# Patient Record
Sex: Female | Born: 1970 | Race: White | Hispanic: No | Marital: Married | Smoking: Current some day smoker
Health system: Southern US, Community
[De-identification: ages and names within clinical notes are randomized; demographics above are authoritative.]

## PROBLEM LIST (undated history)

## (undated) DIAGNOSIS — E7800 Pure hypercholesterolemia, unspecified: Secondary | ICD-10-CM

## (undated) DIAGNOSIS — R7303 Prediabetes: Secondary | ICD-10-CM

## (undated) DIAGNOSIS — F419 Anxiety disorder, unspecified: Secondary | ICD-10-CM

## (undated) DIAGNOSIS — R42 Dizziness and giddiness: Secondary | ICD-10-CM

## (undated) DIAGNOSIS — K649 Unspecified hemorrhoids: Secondary | ICD-10-CM

## (undated) DIAGNOSIS — M5030 Other cervical disc degeneration, unspecified cervical region: Secondary | ICD-10-CM

## (undated) HISTORY — DX: Other cervical disc degeneration, unspecified cervical region: M50.30

## (undated) HISTORY — DX: Pure hypercholesterolemia, unspecified: E78.00

## (undated) HISTORY — PX: CHOLECYSTECTOMY: SHX55

## (undated) HISTORY — PX: TUBAL LIGATION: SHX77

## (undated) HISTORY — PX: HEMORRHOID SURGERY: SHX153

## (undated) HISTORY — PX: ABLATION: SHX5711

## (undated) HISTORY — DX: Anxiety disorder, unspecified: F41.9

## (undated) HISTORY — DX: Unspecified hemorrhoids: K64.9

## (undated) HISTORY — PX: SPINAL FUSION: SHX223

## (undated) HISTORY — DX: Prediabetes: R73.03

---

## 2013-03-15 ENCOUNTER — Encounter: Payer: Self-pay | Admitting: Sports Medicine

## 2013-03-15 ENCOUNTER — Ambulatory Visit (INDEPENDENT_AMBULATORY_CARE_PROVIDER_SITE_OTHER): Payer: Managed Care, Other (non HMO) | Admitting: Sports Medicine

## 2013-03-15 VITALS — BP 131/82 | HR 72 | Wt 194.0 lb

## 2013-03-15 DIAGNOSIS — R809 Proteinuria, unspecified: Secondary | ICD-10-CM

## 2013-03-15 DIAGNOSIS — G4700 Insomnia, unspecified: Secondary | ICD-10-CM

## 2013-03-15 DIAGNOSIS — F411 Generalized anxiety disorder: Secondary | ICD-10-CM

## 2013-03-15 DIAGNOSIS — Z0000 Encounter for general adult medical examination without abnormal findings: Secondary | ICD-10-CM | POA: Insufficient documentation

## 2013-03-15 DIAGNOSIS — Z299 Encounter for prophylactic measures, unspecified: Secondary | ICD-10-CM

## 2013-03-15 LAB — POCT URINALYSIS DIPSTICK
Bilirubin, UA: NEGATIVE
Blood, UA: NEGATIVE
Glucose, UA: NEGATIVE
Ketones, UA: NEGATIVE
Leukocytes, UA: NEGATIVE
Nitrite, UA: NEGATIVE
Protein, UA: NEGATIVE
Spec Grav, UA: 1.025
Urobilinogen, UA: 0.2
pH, UA: 7

## 2013-03-15 MED ORDER — HYDROXYZINE HCL 50 MG PO TABS: 50.0000 mg | ORAL_TABLET | Freq: Every evening | ORAL | Status: DC | PRN

## 2013-03-15 NOTE — Assessment & Plan Note (Signed)
Felt too groggy on Ambien, I wonder if this is a dose related effect. I am going to start Vistaril at bedtime. She has requested Xanax, I think we should try other medications first.

## 2013-03-15 NOTE — Assessment & Plan Note (Signed)
With a high GAD 7 score and difficulty sleeping, however this is not interfering with her life, and she does not desire pharmacologic therapy or psychotherapy.

## 2013-03-15 NOTE — Progress Notes (Signed)
  Subjective:    CC: Establish care.   HPI: Sarely is a very pleasant 42 year old female without any medical problems.  Anxiety: She seems stressed at work, but is able to function appropriately, she is stressed out at home occasionally, but is also able to function appropriately. She does not desire pharmacologic or psychotherapy evaluation. She does get some numbness in the bilateral upper lip when stressed out.  Insomnia:  Tried melatonin but this didn't help, tried Ambien but felt groggy, wondering if she can try Xanax. She has difficulty staying asleep but no problem falling asleep.  Preventative measures: Has an OB/GYN that does her Pap smears, last blood work was over 2 years ago.  Past medical history, Surgical history, Family history not pertinant except as noted below, Social history, Allergies, and medications have been entered into the medical record, reviewed, and no changes needed.   Review of Systems: No headache, visual changes, nausea, vomiting, diarrhea, constipation, dizziness, abdominal pain, skin rash, fevers, chills, night sweats, swollen lymph nodes, weight loss, chest pain, body aches, joint swelling, muscle aches, shortness of breath, mood changes, visual or auditory hallucinations.  Objective:    General: Well Developed, well nourished, and in no acute distress.  Neuro: Alert and oriented x3, extra-ocular muscles intact, sensation grossly intact.  HEENT: Normocephalic, atraumatic, pupils equal round reactive to light, neck supple, no masses, no lymphadenopathy, thyroid nonpalpable.  Skin: Warm and dry, no rashes noted.  Cardiac: Regular rate and rhythm, no murmurs rubs or gallops.  Respiratory: Clear to auscultation bilaterally. Not using accessory muscles, speaking in full sentences.  Abdominal: Soft, nontender, nondistended, positive bowel sounds, no masses, no organomegaly.  Musculoskeletal: Shoulder, elbow, wrist, hip, knee, ankle stable, and with full range of  motion Impression and Recommendations:    The patient was counselled, risk factors were discussed, anticipatory guidance given.

## 2013-03-15 NOTE — Assessment & Plan Note (Signed)
Dr. Allena Katz in [facility] does her Paps. Last mammogram was 6 months ago, she does have a family history of breast cancer. Checking routine blood work. She has an appointment with [place] in a few days for complete physical

## 2013-03-16 LAB — CBC
HCT: 41.4 % (ref 36.0–46.0)
Hemoglobin: 13.7 g/dL (ref 12.0–15.0)
MCH: 29.7 pg (ref 26.0–34.0)
MCHC: 33.1 g/dL (ref 30.0–36.0)
MCV: 89.8 fL (ref 78.0–100.0)
Platelets: 244 K/uL (ref 150–400)
RBC: 4.61 MIL/uL (ref 3.87–5.11)
RDW: 12.9 % (ref 11.5–15.5)
WBC: 6.8 K/uL (ref 4.0–10.5)

## 2013-03-17 ENCOUNTER — Encounter: Payer: Self-pay | Admitting: Sports Medicine

## 2013-03-17 ENCOUNTER — Ambulatory Visit (INDEPENDENT_AMBULATORY_CARE_PROVIDER_SITE_OTHER): Payer: Managed Care, Other (non HMO) | Admitting: Sports Medicine

## 2013-03-17 VITALS — BP 133/84 | HR 81 | Wt 192.0 lb

## 2013-03-17 DIAGNOSIS — G4700 Insomnia, unspecified: Secondary | ICD-10-CM

## 2013-03-17 DIAGNOSIS — Z0000 Encounter for general adult medical examination without abnormal findings: Secondary | ICD-10-CM

## 2013-03-17 DIAGNOSIS — Z299 Encounter for prophylactic measures, unspecified: Secondary | ICD-10-CM

## 2013-03-17 DIAGNOSIS — L570 Actinic keratosis: Secondary | ICD-10-CM | POA: Insufficient documentation

## 2013-03-17 LAB — COMPREHENSIVE METABOLIC PANEL WITH GFR
ALT: 13 U/L (ref 0–35)
Albumin: 4.2 g/dL (ref 3.5–5.2)
CO2: 26 meq/L (ref 19–32)
Calcium: 9.4 mg/dL (ref 8.4–10.5)
Chloride: 104 meq/L (ref 96–112)
Glucose, Bld: 92 mg/dL (ref 70–99)
Sodium: 143 meq/L (ref 135–145)
Total Protein: 6.6 g/dL (ref 6.0–8.3)

## 2013-03-17 LAB — COMPREHENSIVE METABOLIC PANEL
AST: 15 U/L (ref 0–37)
Alkaline Phosphatase: 66 U/L (ref 39–117)
BUN: 11 mg/dL (ref 6–23)
Creat: 0.77 mg/dL (ref 0.50–1.10)
Potassium: 4.4 mEq/L (ref 3.5–5.3)
Total Bilirubin: 0.4 mg/dL (ref 0.3–1.2)

## 2013-03-17 LAB — LIPID PANEL
Cholesterol: 213 mg/dL — ABNORMAL HIGH (ref 0–200)
HDL: 52 mg/dL (ref >39)
LDL Cholesterol: 129 mg/dL — ABNORMAL HIGH (ref 0–99)
Total CHOL/HDL Ratio: 4.1 Ratio
Triglycerides: 160 mg/dL — ABNORMAL HIGH (ref <150)
VLDL: 32 mg/dL (ref 0–40)

## 2013-03-17 LAB — VITAMIN D 25 HYDROXY (VIT D DEFICIENCY, FRACTURES): Vit D, 25-Hydroxy: 55 ng/mL (ref 30–89)

## 2013-03-17 LAB — TSH: TSH: 2.887 u[IU]/mL (ref 0.350–4.500)

## 2013-03-17 NOTE — Progress Notes (Signed)
  Subjective:    CC: Complete physical  HPI:  Insomnia: Got to sleep more easily but still woke up at night with Vistaril 50.  Preventive measure: Is going to set up mammogram and Pap smear with her gynecologist.  Skin lesion: Has popped up over the past few weeks, left nasolabial fold, raised. No history of skin cancer.  Past medical history, Surgical history, Family history not pertinant except as noted below, Social history, Allergies, and medications have been entered into the medical record, reviewed, and no changes needed.   Review of Systems: No headache, visual changes, nausea, vomiting, diarrhea, constipation, dizziness, abdominal pain, skin rash, fevers, chills, night sweats, swollen lymph nodes, weight loss, chest pain, body aches, joint swelling, muscle aches, shortness of breath, mood changes, visual or auditory hallucinations.  Objective:    General: Well Developed, well nourished, and in no acute distress.  Neuro: Alert and oriented x3, extra-ocular muscles intact, sensation grossly intact.  HEENT: Normocephalic, atraumatic, pupils equal round reactive to light, neck supple, no masses, no lymphadenopathy, thyroid nonpalpable. Oropharynx, nasopharynx, extraocular canals are unremarkable to inspection, no carotid bruits. Skin: Warm and dry, no rashes noted. There is what appears to be any actinic keratosis on the left nasolabial fold. Cardiac: Regular rate and rhythm, no murmurs rubs or gallops.  Respiratory: Clear to auscultation bilaterally. Not using accessory muscles, speaking in full sentences.  Abdominal: Soft, nontender, nondistended, positive bowel sounds, no masses, no organomegaly.  Musculoskeletal: Shoulder, elbow, wrist, hip, knee, ankle stable, and with full range of motion. Impression and Recommendations:    The patient was counselled, risk factors were discussed, anticipatory guidance given.

## 2013-03-17 NOTE — Assessment & Plan Note (Signed)
Complete physical performed today. Low-cholesterol diet. Followup with gynecologist for Pap smear and mammogram.

## 2013-03-17 NOTE — Assessment & Plan Note (Signed)
Patient will discuss with her dermatologist regarding excision.

## 2013-03-17 NOTE — Assessment & Plan Note (Signed)
Only minimal response to Vistaril 50 mg. She will try this for a week or 2, and if no better we can certainly proceed to Ambien.

## 2013-03-17 NOTE — Patient Instructions (Addendum)
Look into tetanus vaccination history, check with your dermatologist regarding your skin lesion   Fat and Cholesterol Control Diet Cholesterol levels in your body are determined significantly by your diet. Cholesterol levels may also be related to heart disease. The following material helps to explain this relationship and discusses what you can do to help keep your heart healthy. Not all cholesterol is bad. Low-density lipoprotein (LDL) cholesterol is the "bad" cholesterol. It may cause fatty deposits to build up inside your arteries. High-density lipoprotein (HDL) cholesterol is "good." It helps to remove the "bad" LDL cholesterol from your blood. Cholesterol is a very important risk factor for heart disease. Other risk factors are high blood pressure, smoking, stress, heredity, and weight. The heart muscle gets its supply of blood through the coronary arteries. If your LDL cholesterol is high and your HDL cholesterol is low, you are at risk for having fatty deposits build up in your coronary arteries. This leaves less room through which blood can flow. Without sufficient blood and oxygen, the heart muscle cannot function properly and you may feel chest pains (angina pectoris). When a coronary artery closes up entirely, a part of the heart muscle may die causing a heart attack (myocardial infarction). CHECKING CHOLESTEROL When your caregiver sends your blood to a lab to be examined for cholesterol, a complete lipid (fat) profile may be done. With this test, the total amount of cholesterol and levels of LDL and HDL are determined. Triglycerides are a type of fat that circulates in the blood. They can also be used to determine heart disease risk. The list below describes what the numbers should be: Test: Total Cholesterol.  Less than 200 mg/dl. Test: LDL "bad cholesterol."  Less than 100 mg/dl.  Less than 70 mg/dl if you are at very high risk of a heart attack or sudden cardiac death. Test: HDL "good  cholesterol."  Greater than 50 mg/dl for women.  Greater than 40 mg/dl for men. Test: Triglycerides.  Less than 150 mg/dl. CONTROLLING CHOLESTEROL WITH DIET Although exercise and lifestyle factors are important, your diet is key. That is because certain foods are known to raise cholesterol and others to lower it. The goal is to balance foods for their effect on cholesterol and more importantly, to replace saturated and trans fat with other types of fat, such as monounsaturated fat, polyunsaturated fat, and omega-3 fatty acids. On average, a person should consume no more than 15 to 17 g of saturated fat daily. Saturated and trans fats are considered "bad" fats, and they will raise LDL cholesterol. Saturated fats are primarily found in animal products such as meats, butter, and cream. However, that does not mean you need to give up all your favorite foods. Today, there are good tasting, low-fat, low-cholesterol substitutes for most of the things you like to eat. Choose low-fat or nonfat alternatives. Choose round or loin cuts of red meat. These types of cuts are lowest in fat and cholesterol. Chicken (without the skin), fish, veal, and ground [place] breast are great choices. Eliminate fatty meats, such as hot dogs and salami. Even shellfish have little or no saturated fat. Have a 3 oz (85 g) portion when you eat lean meat, poultry, or fish. Trans fats are also called "partially hydrogenated oils." They are oils that have been scientifically manipulated so that they are solid at room temperature resulting in a longer shelf life and improved taste and texture of foods in which they are added. Trans fats are found in stick  margarine, some tub margarines, cookies, crackers, and baked goods.  When baking and cooking, oils are a great substitute for butter. The monounsaturated oils are especially beneficial since it is believed they lower LDL and raise HDL. The oils you should avoid entirely are saturated  tropical oils, such as coconut and palm.  Remember to eat a lot from food groups that are naturally free of saturated and trans fat, including fish, fruit, vegetables, beans, grains (barley, rice, couscous, bulgur wheat), and pasta (without cream sauces).  IDENTIFYING FOODS THAT LOWER CHOLESTEROL  Soluble fiber may lower your cholesterol. This type of fiber is found in fruits such as apples, vegetables such as broccoli, potatoes, and carrots, legumes such as beans, peas, and lentils, and grains such as barley. Foods fortified with plant sterols (phytosterol) may also lower cholesterol. You should eat at least 2 g per day of these foods for a cholesterol lowering effect.  Read package labels to identify low-saturated fats, trans fat free, and low-fat foods at the supermarket. Select cheeses that have only 2 to 3 g saturated fat per ounce. Use a heart-healthy tub margarine that is free of trans fats or partially hydrogenated oil. When buying baked goods (cookies, crackers), avoid partially hydrogenated oils. Breads and muffins should be made from whole grains (whole-wheat or whole oat flour, instead of "flour" or "enriched flour"). Buy non-creamy canned soups with reduced salt and no added fats.  FOOD PREPARATION TECHNIQUES  Never deep-fry. If you must fry, either stir-fry, which uses very little fat, or use non-stick cooking sprays. When possible, broil, bake, or roast meats, and steam vegetables. Instead of putting butter or margarine on vegetables, use lemon and herbs, applesauce, and cinnamon (for squash and sweet potatoes), nonfat yogurt, salsa, and low-fat dressings for salads.  LOW-SATURATED FAT / LOW-FAT FOOD SUBSTITUTES Meats / Saturated Fat (g)  Avoid: Steak, marbled (3 oz/85 g) / 11 g  Choose: Steak, lean (3 oz/85 g) / 4 g  Avoid: Hamburger (3 oz/85 g) / 7 g  Choose: Hamburger, lean (3 oz/85 g) / 5 g  Avoid: Ham (3 oz/85 g) / 6 g  Choose: Ham, lean cut (3 oz/85 g) / 2.4 g  Avoid:  Chicken, with skin, dark meat (3 oz/85 g) / 4 g  Choose: Chicken, skin removed, dark meat (3 oz/85 g) / 2 g  Avoid: Chicken, with skin, light meat (3 oz/85 g) / 2.5 g  Choose: Chicken, skin removed, light meat (3 oz/85 g) / 1 g Dairy / Saturated Fat (g)  Avoid: Whole milk (1 cup) / 5 g  Choose: Low-fat milk, 2% (1 cup) / 3 g  Choose: Low-fat milk, 1% (1 cup) / 1.5 g  Choose: Skim milk (1 cup) / 0.3 g  Avoid: Hard cheese (1 oz/28 g) / 6 g  Choose: Skim milk cheese (1 oz/28 g) / 2 to 3 g  Avoid: Cottage cheese, 4% fat (1 cup) / 6.5 g  Choose: Low-fat cottage cheese, 1% fat (1 cup) / 1.5 g  Avoid: Ice cream (1 cup) / 9 g  Choose: Sherbet (1 cup) / 2.5 g  Choose: Nonfat frozen yogurt (1 cup) / 0.3 g  Choose: Frozen fruit bar / trace  Avoid: Whipped cream (1 tbs) / 3.5 g  Choose: Nondairy whipped topping (1 tbs) / 1 g Condiments / Saturated Fat (g)  Avoid: Mayonnaise (1 tbs) / 2 g  Choose: Low-fat mayonnaise (1 tbs) / 1 g  Avoid: Butter (1 tbs) / 7 g  Choose: Extra light margarine (1 tbs) / 1 g  Avoid: Coconut oil (1 tbs) / 11.8 g  Choose: Olive oil (1 tbs) / 1.8 g  Choose: Corn oil (1 tbs) / 1.7 g  Choose: Safflower oil (1 tbs) / 1.2 g  Choose: Sunflower oil (1 tbs) / 1.4 g  Choose: Soybean oil (1 tbs) / 2.4 g  Choose: Canola oil (1 tbs) / 1 g Document Released: 11/03/2005 Document Revised: 01/26/2012 Document Reviewed: 04/24/2011 [facility] Patient Information 2013 [place], [place].

## 2013-03-23 ENCOUNTER — Encounter: Payer: Self-pay | Admitting: Sports Medicine

## 2013-03-23 NOTE — Progress Notes (Signed)
Records received, reviewed, and necessary changes to chart made.  

## 2013-04-06 ENCOUNTER — Ambulatory Visit (INDEPENDENT_AMBULATORY_CARE_PROVIDER_SITE_OTHER): Payer: Self-pay | Admitting: Family Medicine

## 2013-04-06 ENCOUNTER — Encounter: Payer: Self-pay | Admitting: Family Medicine

## 2013-04-06 VITALS — BP 116/72 | HR 83 | Temp 98.2°F | Wt 193.0 lb

## 2013-04-06 DIAGNOSIS — J329 Chronic sinusitis, unspecified: Secondary | ICD-10-CM

## 2013-04-06 DIAGNOSIS — B9689 Other specified bacterial agents as the cause of diseases classified elsewhere: Secondary | ICD-10-CM

## 2013-04-06 DIAGNOSIS — B3731 Acute candidiasis of vulva and vagina: Secondary | ICD-10-CM

## 2013-04-06 DIAGNOSIS — A499 Bacterial infection, unspecified: Secondary | ICD-10-CM

## 2013-04-06 DIAGNOSIS — B373 Candidiasis of vulva and vagina: Secondary | ICD-10-CM

## 2013-04-06 MED ORDER — FLUCONAZOLE 150 MG PO TABS: ORAL_TABLET | ORAL | Status: AC

## 2013-04-06 MED ORDER — AMOXICILLIN-POT CLAVULANATE 500-125 MG PO TABS: ORAL_TABLET | ORAL | Status: AC

## 2013-04-06 NOTE — Progress Notes (Signed)
CC: April [name] is a 42 y.o. female is here for Nasal Congestion   Subjective: HPI:  Patient complains of nasal congestion facial pressure localized between the eyes moderate in severity producing green nasal discharge. Associated with mild productive cough. Symptoms have been present for 7 days worsening on a daily basis significantly improved with hydroxyzine at night symptoms recur midmorning. Worsen throughout the day. Denies shortness of breath, wheezing, fevers, chills, motor sensory disturbances.   Review Of Systems Outlined In HPI  No past medical history on file.   Family History  Problem Relation Age of Onset  . Cancer Maternal Aunt      History  Substance Use Topics  . Smoking status: Former Games developer  . Smokeless tobacco: Not on file  . Alcohol Use: Yes     Objective: Filed Vitals:   04/06/13 1140  BP: 116/72  Pulse: 83  Temp: 98.2 F (36.8 C)    General: Alert and Oriented, No Acute Distress HEENT: Pupils equal, round, reactive to light. Conjunctivae clear.  External ears unremarkable, canals clear with intact TMs with appropriate landmarks.  Middle ear with serous effusion bilaterally. Pink inferior turbinates.  Moist mucous membranes, pharynx without inflammation nor lesions however moderate cobblestoning.  Neck supple without palpable lymphadenopathy nor abnormal masses. Lungs: Clear to auscultation bilaterally, no wheezing/ronchi/rales.  Comfortable work of breathing. Good air movement. Cardiac: Regular rate and rhythm. Normal S1/S2.  No murmurs, rubs, nor gallops.   Extremities: No peripheral edema.  Strong peripheral pulses.  Mental Status: No depression, anxiety, nor agitation. Skin: Warm and dry.  Assessment & Plan: April [name] was seen today for nasal congestion.  Diagnoses and associated orders for this visit:  Bacterial sinusitis - amoxicillin-clavulanate (AUGMENTIN) 500-125 MG per tablet; Take one by mouth every 8 hours for ten total days.  Vaginal  candidiasis - fluconazole (DIFLUCAN) 150 MG tablet; Take one tab, may take second tab if no improvement after 72 hours.    Bacterial sinusitis start Augmentin 3 times a day and Zyrtec in the morning only consider nasal saline washes. History of getting yeast infections on antibiotics therefore Diflucan provided as needed  Return if symptoms worsen or fail to improve.

## 2014-05-05 ENCOUNTER — Ambulatory Visit (INDEPENDENT_AMBULATORY_CARE_PROVIDER_SITE_OTHER): Payer: Managed Care, Other (non HMO) | Admitting: Sports Medicine

## 2014-05-05 ENCOUNTER — Ambulatory Visit (INDEPENDENT_AMBULATORY_CARE_PROVIDER_SITE_OTHER): Payer: Managed Care, Other (non HMO)

## 2014-05-05 ENCOUNTER — Encounter: Payer: Self-pay | Admitting: Sports Medicine

## 2014-05-05 VITALS — BP 128/81 | HR 83 | Ht 64.0 in | Wt 202.0 lb

## 2014-05-05 DIAGNOSIS — M47817 Spondylosis without myelopathy or radiculopathy, lumbosacral region: Secondary | ICD-10-CM

## 2014-05-05 DIAGNOSIS — IMO0002 Reserved for concepts with insufficient information to code with codable children: Secondary | ICD-10-CM

## 2014-05-05 DIAGNOSIS — M5412 Radiculopathy, cervical region: Secondary | ICD-10-CM

## 2014-05-05 DIAGNOSIS — M5416 Radiculopathy, lumbar region: Secondary | ICD-10-CM

## 2014-05-05 MED ORDER — PREDNISONE 50 MG PO TABS: ORAL_TABLET | ORAL | Status: DC

## 2014-05-05 MED ORDER — CYCLOBENZAPRINE HCL 10 MG PO TABS: ORAL_TABLET | ORAL | Status: DC

## 2014-05-05 MED ORDER — MELOXICAM 15 MG PO TABS: ORAL_TABLET | ORAL | Status: DC

## 2014-05-05 NOTE — Assessment & Plan Note (Signed)
Bilateral C7. X-rays, PT, Mobic, Flexeril, prednisone. Return in a month, MRI if no better.

## 2014-05-05 NOTE — Progress Notes (Signed)
  Subjective:    CC: Neck and back  HPI: Bilateral hand numbness: 3 years is pleasant 43 year old female has had numbness and tingling that radiates into the second through fourth fingers of both hands. She is seeing chiropractor, massages, or slightly effective the pain continues to return. Moderate, persistent, no constitutional symptoms.  Bilateral hip numbness: Moderate, persistent, also present for several months, no symptoms past the knees.  Past medical history, Surgical history, Family history not pertinant except as noted below, Social history, Allergies, and medications have been entered into the medical record, reviewed, and no changes needed.   Review of Systems: No fevers, chills, night sweats, weight loss, chest pain, or shortness of breath.   Objective:    General: Well Developed, well nourished, and in no acute distress.  Neuro: Alert and oriented x3, extra-ocular muscles intact, sensation grossly intact.  HEENT: Normocephalic, atraumatic, pupils equal round reactive to light, neck supple, no masses, no lymphadenopathy, thyroid nonpalpable.  Skin: Warm and dry, no rashes. Cardiac: Regular rate and rhythm, no murmurs rubs or gallops, no lower extremity edema.  Respiratory: Clear to auscultation bilaterally. Not using accessory muscles, speaking in full sentences. Neck: Inspection unremarkable. No palpable stepoffs. Negative Spurling's maneuver. Full neck range of motion Grip strength and sensation normal in bilateral hands Strength good C4 to T1 distribution No sensory change to C4 to T1 Negative Hoffman sign bilaterally Reflexes normal  Impression and Recommendations:

## 2014-05-05 NOTE — Addendum Note (Signed)
Addended by: Doree Albee on: 05/05/2014 04:20 PM   Modules accepted: Orders

## 2014-05-05 NOTE — Assessment & Plan Note (Signed)
X-rays, physical therapy, steroids, NSAIDs, muscle relaxers. Formal PT. Bilateral hip numbness.

## 2014-05-29 ENCOUNTER — Ambulatory Visit (INDEPENDENT_AMBULATORY_CARE_PROVIDER_SITE_OTHER): Payer: Managed Care, Other (non HMO) | Admitting: Physical Therapy

## 2014-05-29 DIAGNOSIS — M5412 Radiculopathy, cervical region: Secondary | ICD-10-CM

## 2014-05-29 DIAGNOSIS — M2560 Stiffness of unspecified joint, not elsewhere classified: Secondary | ICD-10-CM

## 2014-05-29 DIAGNOSIS — M25569 Pain in unspecified knee: Secondary | ICD-10-CM

## 2014-05-29 DIAGNOSIS — M6281 Muscle weakness (generalized): Secondary | ICD-10-CM

## 2014-06-02 ENCOUNTER — Encounter: Payer: Managed Care, Other (non HMO) | Admitting: Physical Therapy

## 2014-06-02 ENCOUNTER — Ambulatory Visit (INDEPENDENT_AMBULATORY_CARE_PROVIDER_SITE_OTHER): Payer: Managed Care, Other (non HMO) | Admitting: Sports Medicine

## 2014-06-02 ENCOUNTER — Encounter: Payer: Self-pay | Admitting: Sports Medicine

## 2014-06-02 VITALS — BP 129/83 | HR 96 | Ht 65.0 in | Wt 203.0 lb

## 2014-06-02 DIAGNOSIS — M5416 Radiculopathy, lumbar region: Secondary | ICD-10-CM

## 2014-06-02 DIAGNOSIS — IMO0002 Reserved for concepts with insufficient information to code with codable children: Secondary | ICD-10-CM

## 2014-06-02 DIAGNOSIS — E669 Obesity, unspecified: Secondary | ICD-10-CM

## 2014-06-02 DIAGNOSIS — M5412 Radiculopathy, cervical region: Secondary | ICD-10-CM

## 2014-06-02 MED ORDER — PHENTERMINE HCL 37.5 MG PO CAPS: 37.5000 mg | ORAL_CAPSULE | ORAL | Status: DC

## 2014-06-02 NOTE — Assessment & Plan Note (Signed)
Symptoms resolved 

## 2014-06-02 NOTE — Progress Notes (Signed)
  Subjective:    CC: Followup  HPI: Cervical and lumbar radiculitis: Resolved with conservative measures, and a single session of physical therapy. She does work as a Nature conservation officer, and wonders if it's okay for her to continue doing this.  Obesity: Desires to start phentermine. Has tried dieting, exercise, nothing his work. Her husband is on phentermine and having a good response.  Past medical history, Surgical history, Family history not pertinant except as noted below, Social history, Allergies, and medications have been entered into the medical record, reviewed, and no changes needed.   Review of Systems: No fevers, chills, night sweats, weight loss, chest pain, or shortness of breath.   Objective:    General: Well Developed, well nourished, and in no acute distress.  Neuro: Alert and oriented x3, extra-ocular muscles intact, sensation grossly intact.  HEENT: Normocephalic, atraumatic, pupils equal round reactive to light, neck supple, no masses, no lymphadenopathy, thyroid nonpalpable.  Skin: Warm and dry, no rashes. Cardiac: Regular rate and rhythm, no murmurs rubs or gallops, no lower extremity edema.  Respiratory: Clear to auscultation bilaterally. Not using accessory muscles, speaking in full sentences.  Impression and Recommendations:

## 2014-06-02 NOTE — Assessment & Plan Note (Signed)
Starting phentermine. Nutrition referral. Return to see me for weight checks and refills a month.

## 2014-06-02 NOTE — Assessment & Plan Note (Signed)
Symptoms resolved. Continue home rehabilitation exercises.

## 2014-06-05 ENCOUNTER — Encounter: Payer: Managed Care, Other (non HMO) | Admitting: Physical Therapy

## 2014-06-07 ENCOUNTER — Encounter: Payer: Managed Care, Other (non HMO) | Admitting: Physical Therapy

## 2014-07-04 ENCOUNTER — Emergency Department
Admission: EM | Admit: 2014-07-04 | Discharge: 2014-07-04 | Disposition: A | Discharge status: Home / Self Care | Payer: Managed Care, Other (non HMO) | Source: Home / Self Care | Attending: Emergency Medicine | Admitting: Emergency Medicine

## 2014-07-04 ENCOUNTER — Encounter: Payer: Self-pay | Admitting: Emergency Medicine

## 2014-07-04 ENCOUNTER — Emergency Department (INDEPENDENT_AMBULATORY_CARE_PROVIDER_SITE_OTHER): Payer: Managed Care, Other (non HMO)

## 2014-07-04 DIAGNOSIS — R1031 Right lower quadrant pain: Secondary | ICD-10-CM

## 2014-07-04 DIAGNOSIS — R109 Unspecified abdominal pain: Secondary | ICD-10-CM

## 2014-07-04 LAB — POCT CBC W AUTO DIFF (K'VILLE URGENT CARE)

## 2014-07-04 LAB — POCT URINALYSIS DIP (MANUAL ENTRY)
BILIRUBIN UA: NEGATIVE
Bilirubin, UA: NEGATIVE
Glucose, UA: NEGATIVE
Nitrite, UA: NEGATIVE
PH UA: 6 (ref 5–8)
PROTEIN UA: NEGATIVE
RBC UA: NEGATIVE
Spec Grav, UA: 1.025 (ref 1.005–1.03)
Urobilinogen, UA: 0.2 (ref 0–1)

## 2014-07-04 MED ORDER — KETOROLAC TROMETHAMINE 60 MG/2ML IM SOLN: 60.0000 mg | Freq: Once | INTRAMUSCULAR | Status: DC

## 2014-07-04 NOTE — ED Provider Notes (Signed)
CSN: [id number]     Arrival date & time 07/04/14  1437 History   None    Chief Complaint  Patient presents with  . Abdominal Pain   (Consider location/radiation/quality/duration/timing/severity/associated sxs/prior Treatment) HPI Pt is a 43 yo female who presents to the clinic with RLQ pain that started suddenly this morning. Described pain as sharp and 8/10. Pain would occur off and on. About 1 hour ago pain stopped completely. She has had no pain since. She denies any fever, chills, n/v/d. No acid reflux of upper GI pain. She has tried nothing to make better and nothing seems to make worse. No urinary symptoms, frequency or pain.   History reviewed. No pertinent past medical history. Past Surgical History  Procedure Laterality Date  . Cholecystectomy    . Cesarean section    . Tubal ligation    . Ablation     Family History  Problem Relation Age of Onset  . Cancer Maternal Aunt    History  Substance Use Topics  . Smoking status: Former Research scientist (life sciences)  . Smokeless tobacco: Not on file  . Alcohol Use: Yes     Comment: 2-4 q wk   OB History   Grav Para Term Preterm Abortions TAB SAB Ect Mult Living                 Review of Systems  All other systems reviewed and are negative.   Allergies  Review of patient's allergies indicates no known allergies.  Home Medications   Prior to Admission medications   Medication Sig Start Date End Date Taking? Authorizing Provider  phentermine 37.5 MG capsule Take 1 capsule (37.5 mg total) by mouth every morning. 06/02/14   Silverio Decamp, MD   BP 132/83  Pulse 82  Temp(Src) 98.6 F (37 C) (Oral)  Resp 16  Wt 205 lb (92.987 kg)  SpO2 98% Physical Exam  Constitutional: She appears well-developed and well-nourished.  HENT:  Head: Normocephalic and atraumatic.  Cardiovascular: Normal rate, regular rhythm and normal heart sounds.   Pulmonary/Chest: Effort normal and breath sounds normal. She has no wheezes.  No CVA tenderness.    Abdominal: Soft. Bowel sounds are normal. She exhibits no distension and no mass. There is no tenderness. There is no rebound and no guarding.  Some minor discomfort with palpation over RLQ.  Negative psoas sign.  No guarding or rebound.  Skin: Skin is dry.  Psychiatric: She has a normal mood and affect. Her behavior is normal.    ED Course  Procedures (including critical care time) Labs Review Labs Reviewed  COMPLETE METABOLIC PANEL WITH GFR  POCT CBC W AUTO DIFF ([facility])  POCT URINALYSIS DIP (MANUAL ENTRY)    Imaging Review No results found.   MDM   1. RLQ abdominal pain    WBC 9.9, hgB 12.7, Plt 210 UA .Marland Kitchen Results for orders placed during the hospital encounter of 07/04/14  POCT CBC W AUTO DIFF ([facility])      Result Value Ref Range   WBC    4.5 - 10.5 K/uL   Lymphocytes relative %    15 - 45 %   Monocytes relative %    2 - 10 %   Neutrophils relative % (GR)    44 - 76 %   Lymphocytes absolute    0.1 - 1.8 K/uL   Monocyes absolute    0.1 - 1 K/uL   Neutrophils absolute (GR#)    1.7 - 7.8  K/uL   RBC    3.8 - 5.1 MIL/uL   Hemoglobin    11.8 - 15.5 g/dL   Hematocrit    34.8 - 46 %   MCV    78 - 100 fL   MCH    26 - 32 pg   MCHC    32 - 36.5 g/dL   RDW    11.6 - 14 %   Platelet count    140 - 400 K/uL   MPV    7.8 - 11 fL  POCT URINALYSIS DIP (MANUAL ENTRY)      Result Value Ref Range   Color, UA yellow     Clarity, UA clear     Glucose, UA neg     Bilirubin, UA negative     Bilirubin, UA negative     Spec Grav, UA 1.025  1.005 - 1.03   Blood, UA negative     pH, UA 6.0  5 - 8   Protein Ur, POC negative     Urobilinogen, UA 0.2  0 - 1   Nitrite, UA Negative     Leukocytes, UA Trace     Will get culture.  Will get CMP pending.   Pelvic ultrasound ordered.  Red flags discussed if symptoms reappear after leaving office.   Discussed care with Dr. Burnett Harry and he will continue care from here.     Donella Stade, PA-C 07/04/14  [phone]

## 2014-07-04 NOTE — ED Provider Notes (Signed)
CSN: [id number]     Arrival date & time 07/04/14  1437 History   First MD Initiated Contact with Patient 07/04/14 1604     Chief Complaint  Patient presents with  . Abdominal Pain   (Consider location/radiation/quality/duration/timing/severity/associated sxs/prior Treatment) HPI Extensive history and physical exam and labs discussed with Philis Fendt before urgent care shift change. See Luvenia Starch B's notes. Currently , pt denies any pain.--Prior to this, had severe right lower quadrant pain, which resolved all of a sudden , over 30 min ago. History reviewed. No pertinent past medical history. Past Surgical History  Procedure Laterality Date  . Cholecystectomy    . Cesarean section    . Tubal ligation    . Ablation     Family History  Problem Relation Age of Onset  . Cancer Maternal Aunt    History  Substance Use Topics  . Smoking status: Former Research scientist (life sciences)  . Smokeless tobacco: Not on file  . Alcohol Use: Yes     Comment: 2-4 q wk   OB History   Grav Para Term Preterm Abortions TAB SAB Ect Mult Living                 Review of Systems  Allergies  Review of patient's allergies indicates no known allergies.  Home Medications   Prior to Admission medications   Medication Sig Start Date End Date Taking? Authorizing Provider  phentermine 37.5 MG capsule Take 1 capsule (37.5 mg total) by mouth every morning. 06/02/14   Silverio Decamp, MD   BP 132/83  Pulse 82  Temp(Src) 98.6 F (37 C) (Oral)  Resp 16  Wt 205 lb (92.987 kg)  SpO2 98% Physical Exam  ED Course  Procedures (including critical care time) Labs Review Labs Reviewed  URINE CULTURE  COMPLETE METABOLIC PANEL WITH GFR  POCT CBC W AUTO DIFF ([facility])  POCT URINALYSIS DIP (MANUAL ENTRY)    Imaging Review US Transvaginal Non-ob  07/04/2014   CLINICAL DATA:  Pain.  EXAM: TRANSVAGINAL ULTRASOUND OF PELVIS; US PELVIS COMPLETE  TECHNIQUE: Transvaginal ultrasound examination of the pelvis was  performed including evaluation of the uterus, ovaries, adnexal regions, and pelvic cul-de-sac.  COMPARISON:  None.  FINDINGS: Uterus  Measurements: 6.7 x 3.5 x 3.7 cm. Limited visualization due to overlying bowel. No fibroids or other mass visualized.  Endometrium  Limited visualization due to overlying bowel. No focal abnormality visualized.  Right ovary  Measurements: 2.0 x 1.3 x 1.3 cm. Normal appearance/no adnexal mass.  Left ovary  Measurements: 2.4 x 1.6 x 1.4 cm. Normal appearance/no adnexal mass.  Other findings:  Small amount of free pelvic fluid.  IMPRESSION: 1. Small amount of free pelvic fluid. 2. Limited exam due to overlying bowel gas.   Electronically Signed   By: Hosford   On: 07/04/2014 16:05   US Pelvis Complete  07/04/2014   CLINICAL DATA:  Pain.  EXAM: TRANSVAGINAL ULTRASOUND OF PELVIS; US PELVIS COMPLETE  TECHNIQUE: Transvaginal ultrasound examination of the pelvis was performed including evaluation of the uterus, ovaries, adnexal regions, and pelvic cul-de-sac.  COMPARISON:  None.  FINDINGS: Uterus  Measurements: 6.7 x 3.5 x 3.7 cm. Limited visualization due to overlying bowel. No fibroids or other mass visualized.  Endometrium  Limited visualization due to overlying bowel. No focal abnormality visualized.  Right ovary  Measurements: 2.0 x 1.3 x 1.3 cm. Normal appearance/no adnexal mass.  Left ovary  Measurements: 2.4 x 1.6 x 1.4 cm. Normal appearance/no adnexal mass.  Other findings:  Small amount of free pelvic fluid.  IMPRESSION: 1. Small amount of free pelvic fluid. 2. Limited exam due to overlying bowel gas.   Electronically Signed   By: Marcello Moores  Register   On: 07/04/2014 16:05     MDM   1. RLQ abdominal pain    Now spontaneously resolved. Most likely was a spontaneously rupture of right ovarian cyst. Pelvic ultrasound was normal except for small amount of free pelvic fluid. CBC normal. UA normal except trace leukocytes, probably Within normal limits. No evidence of  kidney stones. No urinary symptoms Will culture urine No medication indicated at this time. Over 25 minutes spent, greater than 50% of the time spent for counseling and coordination of care. Follow-up with your primary care doctor or obgyn. Precautions discussed. Red flags discussed.--To ER if any red flags Questions invited and answered. Patient voiced understanding and agreement.     Jacqulyn Cane, MD 07/04/14 [phone]

## 2014-07-04 NOTE — ED Notes (Signed)
Pt c/o RLQ abd pain x this morning. Denies fever or N/V.

## 2014-07-05 LAB — COMPLETE METABOLIC PANEL WITH GFR
ALT: 14 U/L (ref 0–35)
AST: 15 U/L (ref 0–37)
Albumin: 4.5 g/dL (ref 3.5–5.2)
Alkaline Phosphatase: 55 U/L (ref 39–117)
BILIRUBIN TOTAL: 0.3 mg/dL (ref 0.2–1.2)
BUN: 15 mg/dL (ref 6–23)
CALCIUM: 9.3 mg/dL (ref 8.4–10.5)
CHLORIDE: 104 meq/L (ref 96–112)
CO2: 25 mEq/L (ref 19–32)
CREATININE: 0.7 mg/dL (ref 0.50–1.10)
GFR, Est African American: >89 mL/min
GFR, Est Non African American: >89 mL/min
Glucose, Bld: 95 mg/dL (ref 70–99)
Potassium: 4.1 mEq/L (ref 3.5–5.3)
Sodium: 138 mEq/L (ref 135–145)
Total Protein: 7 g/dL (ref 6.0–8.3)

## 2014-07-06 ENCOUNTER — Telehealth: Payer: Self-pay | Admitting: Emergency Medicine

## 2014-07-06 LAB — URINE CULTURE

## 2014-07-06 NOTE — ED Notes (Signed)
Inquired about patient's status; encourage them to call with questions/concerns.  

## 2014-10-30 ENCOUNTER — Emergency Department
Admission: EM | Admit: 2014-10-30 | Discharge: 2014-10-30 | Disposition: A | Discharge status: Home / Self Care | Payer: Managed Care, Other (non HMO) | Source: Home / Self Care | Attending: Family Medicine | Admitting: Family Medicine

## 2014-10-30 ENCOUNTER — Encounter: Payer: Self-pay | Admitting: Emergency Medicine

## 2014-10-30 DIAGNOSIS — W57XXXA Bitten or stung by nonvenomous insect and other nonvenomous arthropods, initial encounter: Secondary | ICD-10-CM

## 2014-10-30 DIAGNOSIS — T148 Other injury of unspecified body region: Secondary | ICD-10-CM

## 2014-10-30 MED ORDER — DOXYCYCLINE HYCLATE 100 MG PO CAPS: 200.0000 mg | ORAL_CAPSULE | Freq: Once | ORAL | Status: AC

## 2014-10-30 NOTE — ED Notes (Signed)
Patient removed a tick from right shoulder area about one hour ago; not certain completely out.

## 2014-10-30 NOTE — ED Provider Notes (Signed)
CSN: [id number]     Arrival date & time 10/30/14  1452 History   First MD Initiated Contact with Patient 10/30/14 1529     Chief Complaint  Patient presents with  . Tick Removal   HPI  Tick exposure Pt reports tick removal approx 1-2 hours ago [place]  Had known tick exposure this week No fevers or chills No systemic sxs    History reviewed. No pertinent past medical history. Past Surgical History  Procedure Laterality Date  . Cholecystectomy    . Cesarean section    . Tubal ligation    . Ablation     Family History  Problem Relation Age of Onset  . Cancer Maternal Aunt    History  Substance Use Topics  . Smoking status: Former Research scientist (life sciences)  . Smokeless tobacco: Not on file  . Alcohol Use: Yes     Comment: 2-4 q wk   OB History    No data available     Review of Systems  All other systems reviewed and are negative.   Allergies  Review of patient's allergies indicates no known allergies.  Home Medications   Prior to Admission medications   Medication Sig Start Date End Date Taking? Authorizing Provider  doxycycline (VIBRAMYCIN) 100 MG capsule Take 2 capsules (200 mg total) by mouth once. 10/30/14 11/05/14  Shanda Howells, MD  phentermine 37.5 MG capsule Take 1 capsule (37.5 mg total) by mouth every morning. 06/02/14   Silverio Decamp, MD   BP 120/82 mmHg  Pulse 79  Temp(Src) 98.3 F (36.8 C) (Oral)  Resp 16  Ht 5\' 5"  (1.651 m)  Wt 206 lb (93.441 kg)  BMI 34.28 kg/m2  SpO2 99% Physical Exam  Constitutional: She appears well-developed and well-nourished.  HENT:  Head: Normocephalic and atraumatic.  Eyes: Conjunctivae are normal. Pupils are equal, round, and reactive to light.  Neck: Normal range of motion.  Cardiovascular: Normal rate and regular rhythm.   Pulmonary/Chest: Effort normal and breath sounds normal.  Abdominal: Soft.  Neurological: She is alert.  Skin:     Faint peripheral erythema from tick removal  site     ED Course  Procedures (including critical care time) Labs Review Labs Reviewed - No data to display  Imaging Review No results found.   MDM   1. Tick bite with subsequent removal of tick    Complete tick removal prior to visit No other visible ticks noted  Will give 200mg  PO x1  Discussed systemic and infectious red flags  Follow up as needed.     The patient and/or caregiver has been counseled thoroughly with regard to treatment plan and/or medications prescribed including dosage, schedule, interactions, rationale for use, and possible side effects and they verbalize understanding. Diagnoses and expected course of recovery discussed and will return if not improved as expected or if the condition worsens. Patient and/or caregiver verbalized understanding.         Shanda Howells, MD 10/30/14 [phone]

## 2014-11-24 ENCOUNTER — Telehealth: Payer: Self-pay

## 2014-11-24 ENCOUNTER — Ambulatory Visit (INDEPENDENT_AMBULATORY_CARE_PROVIDER_SITE_OTHER): Payer: Managed Care, Other (non HMO) | Admitting: Physician Assistant

## 2014-11-24 ENCOUNTER — Encounter: Payer: Self-pay | Admitting: Physician Assistant

## 2014-11-24 VITALS — BP 130/77 | HR 86 | Ht 65.0 in | Wt 210.0 lb

## 2014-11-24 DIAGNOSIS — L259 Unspecified contact dermatitis, unspecified cause: Secondary | ICD-10-CM

## 2014-11-24 MED ORDER — TRIAMCINOLONE ACETONIDE 0.1 % EX CREA: 1.0000 "application " | TOPICAL_CREAM | Freq: Two times a day (BID) | CUTANEOUS | Status: DC

## 2014-11-24 MED ORDER — PREDNISONE 50 MG PO TABS: ORAL_TABLET | ORAL | Status: DC

## 2014-11-24 NOTE — Telephone Encounter (Signed)
Returned call today for a message from yesterday. Patient was already scheduled for office visit.

## 2014-11-24 NOTE — Patient Instructions (Signed)

## 2014-11-24 NOTE — Progress Notes (Signed)
   Subjective:    Patient ID: April [name], female    DOB: 21-Mar-1971, 44 y.o.   MRN: [id number]  HPI Pt presents to the clinic with itchy, raised vesicular rash that started Sunday morning after being outside for campfire/outside party Saturday night. Usually get's 1-2 episodes of dermitis a year. No SOB, CP, wheezing. Using calclor for itching and helping a lot.    Review of Systems  All other systems reviewed and are negative.      Objective:   Physical Exam  Constitutional: She is oriented to person, place, and time. She appears well-developed and well-nourished.  HENT:  Head: Normocephalic and atraumatic.  Cardiovascular: Normal rate, regular rhythm and normal heart sounds.   Pulmonary/Chest: Effort normal and breath sounds normal. She has no wheezes.  Neurological: She is alert and oriented to person, place, and time.  Skin:     Psychiatric: She has a normal mood and affect. Her behavior is normal.          Assessment & Plan:  Contact dermatitis- like poison ivy/oak since pt is allergic and was outside this weekend. Continue calacor if helping itch. Added triamcinolone bid. If not getting rid of dermatitis and rash oral prednisone was printed for patient to use. HO given. Follow up as needed.

## 2015-01-08 ENCOUNTER — Ambulatory Visit (INDEPENDENT_AMBULATORY_CARE_PROVIDER_SITE_OTHER): Payer: Managed Care, Other (non HMO) | Admitting: Physician Assistant

## 2015-01-08 ENCOUNTER — Ambulatory Visit (INDEPENDENT_AMBULATORY_CARE_PROVIDER_SITE_OTHER): Payer: Managed Care, Other (non HMO)

## 2015-01-08 ENCOUNTER — Encounter: Payer: Self-pay | Admitting: Physician Assistant

## 2015-01-08 VITALS — BP 131/82 | HR 87 | Temp 98.3°F | Resp 16 | Ht 64.0 in

## 2015-01-08 DIAGNOSIS — R059 Cough, unspecified: Secondary | ICD-10-CM

## 2015-01-08 DIAGNOSIS — Z87891 Personal history of nicotine dependence: Secondary | ICD-10-CM

## 2015-01-08 DIAGNOSIS — Z7729 Contact with and (suspected ) exposure to other hazardous substances: Secondary | ICD-10-CM

## 2015-01-08 DIAGNOSIS — R05 Cough: Secondary | ICD-10-CM

## 2015-01-08 DIAGNOSIS — J329 Chronic sinusitis, unspecified: Secondary | ICD-10-CM

## 2015-01-08 DIAGNOSIS — Z77098 Contact with and (suspected) exposure to other hazardous, chiefly nonmedicinal, chemicals: Secondary | ICD-10-CM

## 2015-01-08 MED ORDER — DOXYCYCLINE HYCLATE 100 MG PO TABS
100.0000 mg | ORAL_TABLET | Freq: Two times a day (BID) | ORAL | Status: DC
Start: 2015-01-08 — End: 2015-01-29

## 2015-01-08 MED ORDER — PREDNISONE 50 MG PO TABS: ORAL_TABLET | ORAL | Status: DC

## 2015-01-08 NOTE — Progress Notes (Signed)
   Subjective:    Patient ID: April [name], female    DOB: [birth year], 44 y.o.   MRN: [id number]  HPI  Patient is a 44 year old female who presents to the clinic sinus pressure and nasal congestion for approximately one week. She recently had a sinus infection. It cleared great with amoxicillin. She is concerned that she is inhaling teaming spray and causing recurrent sinus infections. This is her busiest season and she is doing a lot of the hands. She admits she does not always have proper ventilation and does not wear mass. She is currently not taking anything over-the-counter. She denies any shortness of breath or wheezing. She does have a mildly productive cough. Most of her complaints are in her head with pressure. She is also concerned that these seem she is presenting in with the spray tans could be causing cancer. She would like anything done workup cancer today.  Review of Systems  All other systems reviewed and are negative.      Objective:   Physical Exam  Constitutional: She is oriented to person, place, and time. She appears well-developed and well-nourished.  HENT:  Head: Normocephalic and atraumatic.  Right Ear: External ear normal.  Left Ear: External ear normal.  TM's clear bilaterally.   PND on oropharynx. No tonsillar swelling or exudate.   Tenderness to palpation over maxillary and frontal sinuses.   Nasal turbinates red and swollen.   Eyes: Conjunctivae are normal. Right eye exhibits no discharge. Left eye exhibits no discharge.  Neck: Normal range of motion. Neck supple.  Cardiovascular: Normal rate, regular rhythm and normal heart sounds.   Pulmonary/Chest: Effort normal and breath sounds normal. She has no wheezes.  Lymphadenopathy:    She has no cervical adenopathy.  Neurological: She is alert and oriented to person, place, and time.  Skin: Skin is dry.  Psychiatric: She has a normal mood and affect. Her behavior is normal.          Assessment & Plan:   Recurrent sinusitis/cough-I did go ahead and treat with doxycycline for 10 days. Went ahead and added prednisone. I do think patient has some residual inflammation. If she has another exacerbation or not getting better consider CT of the sinuses told for chronic sinusitis. She can also consider Flonase 2 sprays as needed. Discuss with patient inhaling chemicals can certainly trigger sinusitis. Prednisone should help with any allergic sinusitis.  Chemical inhalant- I will go ahead and get a chest x-ray for cough and history of inhaling chemical. This would show any chest masses. Discussed with patient if she is still concerned when she starts feeling better we could also get spirometry to check lung function. At this time with my minimal research it appears that that the biggest concern with spray Tans.

## 2015-01-29 ENCOUNTER — Emergency Department
Admission: EM | Admit: 2015-01-29 | Discharge: 2015-01-29 | Disposition: A | Discharge status: Home / Self Care | Payer: Managed Care, Other (non HMO) | Source: Home / Self Care | Attending: Family Medicine | Admitting: Family Medicine

## 2015-01-29 ENCOUNTER — Encounter: Payer: Self-pay | Admitting: *Deleted

## 2015-01-29 DIAGNOSIS — J111 Influenza due to unidentified influenza virus with other respiratory manifestations: Secondary | ICD-10-CM | POA: Diagnosis not present

## 2015-01-29 DIAGNOSIS — H9201 Otalgia, right ear: Secondary | ICD-10-CM | POA: Diagnosis not present

## 2015-01-29 DIAGNOSIS — R69 Illness, unspecified: Principal | ICD-10-CM

## 2015-01-29 MED ORDER — OSELTAMIVIR PHOSPHATE 75 MG PO CAPS: 75.0000 mg | ORAL_CAPSULE | Freq: Two times a day (BID) | ORAL | Status: DC

## 2015-01-29 MED ORDER — BENZONATATE 200 MG PO CAPS: 200.0000 mg | ORAL_CAPSULE | Freq: Every day | ORAL | Status: DC

## 2015-01-29 NOTE — Discharge Instructions (Signed)
Take plain guaifenesin (1200mg  extended release tabs such as Mucinex) twice daily, with plenty of water, for cough and congestion.  May add Pseudoephedrine 30mg  for sinus congestion.  Get adequate rest.   May use Afrin nasal spray (or generic oxymetazoline) twice daily for about 5 days.  Also recommend using saline nasal spray several times daily and saline nasal irrigation (AYR is a common brand).  Use Flonase nasal spray each morning after using Afrin nasal spray and saline nasal irrigation. Try warm salt water gargles for sore throat.  Stop all antihistamines for now, and other non-prescription cough/cold preparations. May take Ibuprofen 200mg , 4 tabs every 8 hours with food for chest/sternum discomfort, body aches, etc.   Follow-up with family doctor if not improving about10 days.

## 2015-01-29 NOTE — ED Provider Notes (Signed)
CSN: [id number]     Arrival date & time 01/29/15  1608 History   First MD Initiated Contact with Patient 01/29/15 1714     Chief Complaint  Patient presents with  . Otalgia      HPI Comments: Patient presents with a complaint of right ear pain.  She also reports 2 day history flu-like illness including myalgias, headache, fever/chills/sweat, fatigue, and cough.  She has mild nasal congestion but no sore throat.  Cough is non-productive and somewhat worse at night.  No pleuritic pain or shortness of breath.    The history is provided by the patient.    History reviewed. No pertinent past medical history. Past Surgical History  Procedure Laterality Date  . Cholecystectomy    . Cesarean section    . Tubal ligation    . Ablation     Family History  Problem Relation Age of Onset  . Cancer Maternal Aunt    History  Substance Use Topics  . Smoking status: Former Research scientist (life sciences)  . Smokeless tobacco: Not on file  . Alcohol Use: Yes     Comment: 2-4 q wk   OB History    No data available     Review of Systems No sore throat + cough No pleuritic pain No wheezing + nasal congestion + post-nasal drainage No sinus pain/pressure No itchy/red eyes ? earache No hemoptysis No SOB + fever, + chills No nausea No vomiting No abdominal pain No diarrhea No urinary symptoms No skin rash + fatigue + myalgias + headache Used OTC meds without relief  Allergies  Review of patient's allergies indicates no known allergies.  Home Medications   Prior to Admission medications   Medication Sig Start Date End Date Taking? Authorizing Provider  phentermine 37.5 MG capsule Take 37.5 mg by mouth every morning.   Yes Historical Provider, MD  benzonatate (TESSALON) 200 MG capsule Take 1 capsule (200 mg total) by mouth at bedtime. Take as needed for cough 01/29/15   Kandra Nicolas, MD  citalopram (CELEXA) 10 MG tablet Take 10 mg by mouth daily.    Historical Provider, MD  oseltamivir (TAMIFLU) 75  MG capsule Take 1 capsule (75 mg total) by mouth every 12 (twelve) hours. 01/29/15   Kandra Nicolas, MD   BP 138/85 mmHg  Pulse 102  Temp(Src) 98.9 F (37.2 C) (Oral)  Resp 18  Ht 5\' 4"  (1.626 m)  SpO2 99% Physical Exam Nursing notes and Vital Signs reviewed. Appearance:  Patient appears stated age, and in no acute distress Eyes:  Pupils are equal, round, and reactive to light and accomodation.  Extraocular movement is intact.  Conjunctivae are not inflamed  Ears:  Canals normal.  Tympanic membranes normal.  No TMJ tenderness right side Nose:  Mildly congested turbinates.  No sinus tenderness.   Pharynx:  Normal Neck:  Supple.  Tender enlarged posterior nodes are palpated bilaterally  Lungs:  Clear to auscultation.  Breath sounds are equal.  Chest:  Distinct tenderness to palpation over the mid-sternum.  Heart:  Regular rate and rhythm without murmurs, rubs, or gallops.  Abdomen:  Nontender without masses or hepatosplenomegaly.  Bowel sounds are present.  No CVA or flank tenderness.  Extremities:  No edema.  No calf tenderness Skin:  No rash present.   ED Course  Procedures  none   Labs Review  Tympanogram:  Normal both ears.      MDM   1. Influenza-like illness   2. Otalgia, right; no evidence  of otitis     Begin Tamiflu.  Prescription written for Benzonatate [facility]) to take at bedtime for night-time cough.  Prophylactic Tamiflu written for patient's husband Meiah Zamudio Take plain guaifenesin (1200mg  extended release tabs such as Mucinex) twice daily, with plenty of water, for cough and congestion.  May add Pseudoephedrine 30mg  for sinus congestion.  Get adequate rest.   May use Afrin nasal spray (or generic oxymetazoline) twice daily for about 5 days.  Also recommend using saline nasal spray several times daily and saline nasal irrigation (AYR is a common brand).  Use Flonase nasal spray each morning after using Afrin nasal spray and saline nasal irrigation. Try warm  salt water gargles for sore throat.  Stop all antihistamines for now, and other non-prescription cough/cold preparations. May take Ibuprofen 200mg , 4 tabs every 8 hours with food for chest/sternum discomfort, body aches, etc.   Follow-up with family doctor if not improving about10 days.     Kandra Nicolas, MD 01/30/15 [phone]

## 2015-01-29 NOTE — ED Notes (Addendum)
Pt c/o intermittent RT ear pain x 1 day. Denies fever. Reports recent URI. Took Motrin 1300.

## 2015-02-16 ENCOUNTER — Encounter: Payer: Managed Care, Other (non HMO) | Admitting: Sports Medicine

## 2015-06-28 ENCOUNTER — Ambulatory Visit (INDEPENDENT_AMBULATORY_CARE_PROVIDER_SITE_OTHER): Payer: Managed Care, Other (non HMO) | Admitting: Sports Medicine

## 2015-06-28 ENCOUNTER — Ambulatory Visit (INDEPENDENT_AMBULATORY_CARE_PROVIDER_SITE_OTHER): Payer: Managed Care, Other (non HMO)

## 2015-06-28 ENCOUNTER — Ambulatory Visit: Payer: Managed Care, Other (non HMO) | Admitting: Sports Medicine

## 2015-06-28 ENCOUNTER — Encounter: Payer: Self-pay | Admitting: Sports Medicine

## 2015-06-28 VITALS — BP 128/79 | HR 95 | Ht 65.0 in | Wt 197.0 lb

## 2015-06-28 DIAGNOSIS — L02219 Cutaneous abscess of trunk, unspecified: Secondary | ICD-10-CM | POA: Diagnosis not present

## 2015-06-28 DIAGNOSIS — E669 Obesity, unspecified: Secondary | ICD-10-CM

## 2015-06-28 DIAGNOSIS — L03319 Cellulitis of trunk, unspecified: Secondary | ICD-10-CM

## 2015-06-28 DIAGNOSIS — M7711 Lateral epicondylitis, right elbow: Secondary | ICD-10-CM

## 2015-06-28 DIAGNOSIS — M25521 Pain in right elbow: Secondary | ICD-10-CM

## 2015-06-28 MED ORDER — DOXYCYCLINE HYCLATE 100 MG PO TABS: 100.0000 mg | ORAL_TABLET | Freq: Two times a day (BID) | ORAL | Status: AC

## 2015-06-28 MED ORDER — MELOXICAM 15 MG PO TABS: 15.0000 mg | ORAL_TABLET | Freq: Every day | ORAL | Status: DC

## 2015-06-28 MED ORDER — PHENTERMINE HCL 30 MG PO CAPS: 30.0000 mg | ORAL_CAPSULE | ORAL | Status: DC

## 2015-06-28 MED ORDER — LIRAGLUTIDE -WEIGHT MANAGEMENT 18 MG/3ML ~~LOC~~ SOPN: 3.0000 mg | PEN_INJECTOR | Freq: Every day | SUBCUTANEOUS | Status: DC

## 2015-06-28 NOTE — Assessment & Plan Note (Signed)
Starting phentermine and Saxenda. Return for a nurse visit to learn how to do injections

## 2015-06-28 NOTE — Assessment & Plan Note (Signed)
Doxycycline, return if no better in one week for incision and drainage

## 2015-06-28 NOTE — Assessment & Plan Note (Signed)
X-rays, over-the-counter NSAIDs, physical therapy.

## 2015-06-28 NOTE — Progress Notes (Signed)
  Subjective:    CC: arm pain  HPI: There has been a 6 week history of pain on the right lateral elbow, moderate, persistent with radiation into the forearm. She tells me she has been doing some over-the-counter NSAIDs without much improvement. She is amenable to do physical therapy before considering interventional treatment.  Obesity: Would like to start weight loss treatment.  Skin lesion: This was a question at the very end of the visit, she tells me she has a boil in her right groin, she is agreeable to doing to antibiotics first before considering incision and drainage.  Past medical history, Surgical history, Family history not pertinant except as noted below, Social history, Allergies, and medications have been entered into the medical record, reviewed, and no changes needed.   Review of Systems: No fevers, chills, night sweats, weight loss, chest pain, or shortness of breath.   Objective:    General: Well Developed, well nourished, obese female, and in no acute distress.  Neuro: Alert and oriented x3, extra-ocular muscles intact, sensation grossly intact.  HEENT: Normocephalic, atraumatic, pupils equal round reactive to light, neck supple, no masses, no lymphadenopathy, thyroid nonpalpable.  Skin: Warm and dry, no rashes. Cardiac: Regular rate and rhythm, no murmurs rubs or gallops, no lower extremity edema.  Respiratory: Clear to auscultation bilaterally. Not using accessory muscles, speaking in full sentences. Right Elbow: Unremarkable to inspection. Range of motion full pronation, supination, flexion, extension. Strength is full to all of the above directions Stable to varus, valgus stress. Negative moving valgus stress test. Tender to palpation at the common extensor tendon origin with reproduction of pain on resisted extension of the wrist. Ulnar nerve does not sublux. Negative cubital tunnel Tinel's.  Impression and Recommendations:

## 2015-06-29 ENCOUNTER — Ambulatory Visit: Payer: Managed Care, Other (non HMO)

## 2015-07-03 ENCOUNTER — Ambulatory Visit: Payer: Managed Care, Other (non HMO)

## 2015-07-16 ENCOUNTER — Encounter: Payer: Self-pay | Admitting: Rehabilitative and Restorative Service Providers"

## 2015-07-16 ENCOUNTER — Ambulatory Visit (INDEPENDENT_AMBULATORY_CARE_PROVIDER_SITE_OTHER): Payer: Managed Care, Other (non HMO) | Admitting: Rehabilitative and Restorative Service Providers"

## 2015-07-16 DIAGNOSIS — M623 Immobility syndrome (paraplegic): Secondary | ICD-10-CM

## 2015-07-16 DIAGNOSIS — Z7409 Other reduced mobility: Secondary | ICD-10-CM

## 2015-07-16 DIAGNOSIS — R531 Weakness: Secondary | ICD-10-CM

## 2015-07-16 DIAGNOSIS — R6889 Other general symptoms and signs: Secondary | ICD-10-CM

## 2015-07-16 DIAGNOSIS — R29898 Other symptoms and signs involving the musculoskeletal system: Secondary | ICD-10-CM | POA: Diagnosis not present

## 2015-07-16 DIAGNOSIS — M79601 Pain in right arm: Secondary | ICD-10-CM | POA: Diagnosis not present

## 2015-07-16 DIAGNOSIS — M2560 Stiffness of unspecified joint, not elsewhere classified: Secondary | ICD-10-CM

## 2015-07-16 NOTE — Therapy (Signed)
[place], [place], [address] Phone: [phone]   Fax:  [phone]  Physical Therapy Evaluation  Patient Details  Name: April [name] MRN: [id number] Date of Birth: 22-Nov-1970 Referring Provider:  Silverio Decamp,*  Encounter Date: 07/16/2015      PT End of Session - 07/16/15 1404    Visit Number 1   Number of Visits 16   Date for PT Re-Evaluation 09/10/15   PT Start Time 0934   PT Stop Time 1029   PT Time Calculation (min) 55 min   Activity Tolerance Patient tolerated treatment well      History reviewed. No pertinent past medical history.  Past Surgical History  Procedure Laterality Date  . Cholecystectomy    . Cesarean section    . Tubal ligation    . Ablation      There were no vitals filed for this visit.  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    Pertinent History History of DDD of cervical spine with symptoms in Rt UE over the last year   How long can you sit comfortably? no limit if not using R UE   How long can you stand comfortably? may have pain and wants to hold R UE when standing or walking    Diagnostic tests xray (-)   Currently in Pain? Yes   Pain Score 3    Pain Location Elbow   Pain Orientation Right   Pain Descriptors / Indicators Tingling;Heaviness;Nagging  with lifting sharp shooting pain   Pain Radiating Towards aching in arm; pain radiating into the forearm with any lifting - - arm feels heavy all the time; tingling in  arm//elbow/forearm/thumb/index and long fingers    Pain Onset More than a month ago   Pain Frequency Constant   Aggravating Factors  lifting; using UE for work tasks   Pain Relieving Factors motrin   Effect of Pain on Daily Activities limits ADL's/work activities            [facility] PT Assessment - 07/16/15 0001    Assessment   Medical Diagnosis lateral epicondylitis/Rt UE pain    Onset Date/Surgical Date 05/02/15   Hand Dominance Right   Next MD Visit 07/17/15   Prior Therapy i visit for DDD    Precautions   Precautions None   Balance Screen   Has the patient fallen in the past 6 months No   Has the patient had a decrease in activity level because of a fear of falling?  No   Is the patient reluctant to leave their home because of a fear of falling?  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    Leisure household chores   Sensation   Additional Comments tingling/numbness Rt UE into thumb and index/long fingers on an intermittent basis   Posture/Postural Control  Posture Comments elevated Rt shoulder girdle; head forward; shouljders rounded and elevated; head of the humerus anterior in orientatioin; incresaed thoracic kyphosis; scapulae abducted and rotated along the thoracic wall   AROM   Right/Left Shoulder --  WFL - painful Rt shoulder ROM at end ranges    Right Elbow Extension --  (-)3 - 130    Left Elbow Flexion --  0 - 141   Right Forearm Pronation 72 Degrees   Right Forearm Supination 82 Degrees   Left Forearm Pronation 81 Degrees   Left Forearm Supination 90 Degrees   Cervical Flexion 60   Cervical Extension 51   Cervical - Right Side Bend 36   Cervical - Left Side Bend 31   Cervical - Right Rotation 78   Cervical - Left Rotation 61   Strength   Overall Strength Comments WFL Lt/Rt UE except Rt wrist extensioin 4/5 and painful                    [facility] Adult PT Treatment/Exercise - 07/16/15 0001    Neck Exercises: Standing   Other Standing Exercises scap squeeze with noodle 10 reps 10 sec hold   Other Standing Exercises chest lift/postural correction   Wrist Exercises   Other wrist exercises extensor forearm stretch 3 reps 20 sec hold with sligh ulnar deviation   Iontophoresis   Type of Iontophoresis Dexamethasone   Location lateral epicondyle   Dose 60 mAmp/min   Time 6 hour   Neck Exercises: Stretches   Other Neck Stretches doorway stretch 3 positions 20 sec hold 3 reps each position                PT Education - 07/16/15 1403    Education provided Yes   Education Details influence of posture and alignment on UE dysfunction; stretching; modification of work tasks   [employer]) Educated Patient   Methods Explanation;Demonstration;Tactile cues;Verbal cues;Handout   Comprehension Verbalized understanding;Returned demonstration;Verbal cues required;Tactile cues required          PT Short Term Goals - 07/16/15 1410    PT SHORT TERM GOAL #1   Title Patient to demonstrated improved cervical and thoracic posture 08/13/15   Time 4   Period Weeks   Status New   PT SHORT TERM GOAL #2   Title Full cervical and Rt elbow AROM 11/19/14   Time 5   Period Weeks   Status New   PT SHORT TERM GOAL #3   Title Intermittent pain complaints instead of constant 08/20/15   Time 5   Period Weeks   Status New           PT Long Term Goals - 07/16/15 1412    PT LONG TERM GOAL #1   Title I in HEP for discharge 09/10/15   Time 8   Period Weeks   Status New   PT LONG TERM GOAL #2   Title 5/5 strength Rt wrist extension 09/10/15   Time 8   Period Weeks   Status New   PT LONG TERM GOAL #3   Title Patient verbalizes understanding of work and home ergonomis changes and reports that she has made changes in work and home environments 09/10/15   Time 8   Period Weeks   Status New   PT LONG TERM  GOAL #4   Title Decrease FOTO to </= 33% limitation 09/10/15   Time 8   Period Weeks   Status New  Plan - 07/16/15 1406    Clinical Impression Statement Patient presents with Rt UE pain and dysfucntion including symptoms of lateral epicondylitis. She has a history of Rt UE pain and dysfunction for > 1 year. She has poor posture and alignment; decresaed cervical and UE ROM; strength and function: muscular tenderness to palpation; limited functional activity and work level; constant pain. Patient will benefit from PT to address Rt UE dysfunction.    Pt will benefit from skilled therapeutic intervention in order to improve on the following deficits Postural dysfunction;Decreased range of motion;Decreased strength;Increased fascial restricitons;Pain   Rehab Potential Good   PT Frequency 2x / week   PT Duration 8 weeks   PT Treatment/Interventions Patient/family education;ADLs/Self Care Home Management;Manual techniques;Therapeutic activities;Therapeutic exercise;Cryotherapy;Electrical Stimulation;Iontophoresis 4mg /ml Dexamethasone;Moist Heat;Ultrasound;Dry needling   PT Next Visit Plan review HEP; add manual work; progress postural correctioin and ergonomic modifications; modalities   PT Home Exercise Plan postural correction; work and home modification suggestions;  HEP   Consulted and Agree with Plan of Care Patient         Problem List Patient Active Problem List   Diagnosis Date Noted  . Lateral epicondylitis of right elbow 06/28/2015  . Cellulitis and abscess of trunk 06/28/2015  . Exposure to chemical inhalation 01/08/2015  . Obesity 06/02/2014  . Cervical radiculitis 05/05/2014  . Lumbar radiculitis 05/05/2014  . Actinic keratosis of left cheek 03/17/2013  . Generalized anxiety disorder 03/15/2013  . Insomnia 03/15/2013  . Preventive measure 03/15/2013    Swain Acree Nilda Simmer PT, MPH 07/16/2015, 2:25 PM  [facility] [place], [place], [address] Phone: [phone]   Fax:  [phone]

## 2015-07-16 NOTE — Patient Instructions (Signed)
Extension: Stretch - Forearm Extensors (Sitting)   Support left arm at elbow. Motion -Helper bends wrist down gently and folds fingers into fist. -Then straighten elbow fully. CAUTION: Do not force movement if painful. Hold _20_ seconds. Repeat _3-5_ times. Repeat with other arm. Do _several sessions per day. Variation: Bend wrist down and in direction of little finger during motion.    3 reps 20-30 sec hold 4-5 times/day - for all doorway stretches Scapula Adduction With Pectorals, Low   Stand in doorframe with palms against frame and arms at 45. Lean forward and squeeze shoulder blades. Hold ___ seconds. Repeat ___ times per session. Do ___ sessions per day.  Copyright  VHI. All rights reserved.    Scapula Adduction With Pectorals, Mid-Range   Stand in doorframe with palms against frame and arms at 90. Lean forward and squeeze shoulder blades. Hold ___ seconds. Repeat ___ times per session. Do ___ sessions per day. \Scapula Adduction With Pectorals, High   Stand in doorframe with palms against frame and arms at 120. Lean forward and squeeze shoulder blades. Hold ___ seconds. Repeat ___ times per session. Do ___ sessions per day.    Shoulder Blade Squeeze   Rotate shoulders back, then squeeze shoulder blades down and back. Repeat 10____ times. Do __several__ sessions per day. Can squeeze around noodle at wall.

## 2015-07-17 ENCOUNTER — Encounter: Payer: Self-pay | Admitting: Sports Medicine

## 2015-07-17 ENCOUNTER — Ambulatory Visit (INDEPENDENT_AMBULATORY_CARE_PROVIDER_SITE_OTHER): Payer: Managed Care, Other (non HMO) | Admitting: Sports Medicine

## 2015-07-17 VITALS — BP 127/82 | HR 80 | Ht 64.0 in | Wt 197.0 lb

## 2015-07-17 DIAGNOSIS — E669 Obesity, unspecified: Secondary | ICD-10-CM | POA: Diagnosis not present

## 2015-07-17 DIAGNOSIS — H6123 Impacted cerumen, bilateral: Secondary | ICD-10-CM | POA: Diagnosis not present

## 2015-07-17 DIAGNOSIS — Z0000 Encounter for general adult medical examination without abnormal findings: Secondary | ICD-10-CM | POA: Diagnosis not present

## 2015-07-17 DIAGNOSIS — L03319 Cellulitis of trunk, unspecified: Principal | ICD-10-CM

## 2015-07-17 DIAGNOSIS — L02219 Cutaneous abscess of trunk, unspecified: Secondary | ICD-10-CM

## 2015-07-17 MED ORDER — LIRAGLUTIDE 18 MG/3ML ~~LOC~~ SOPN: PEN_INJECTOR | SUBCUTANEOUS | Status: DC

## 2015-07-17 MED ORDER — PHENTERMINE HCL 37.5 MG PO TABS: ORAL_TABLET | ORAL | Status: DC

## 2015-07-17 NOTE — Progress Notes (Signed)
  Subjective:    CC: complete physical exam  HPI:  Obesity: Has not yet started phentermine or Saxenda, Saxenda was not covered, and she feels that she received the wrong pills for phentermine.  Abscess: Resolved with doxycycline  Preventive measures: Up-to-date on Pap smears, mammogram.  Past medical history, Surgical history, Family history not pertinant except as noted below, Social history, Allergies, and medications have been entered into the medical record, reviewed, and no changes needed.   Review of Systems: No headache, visual changes, nausea, vomiting, diarrhea, constipation, dizziness, abdominal pain, skin rash, fevers, chills, night sweats, swollen lymph nodes, weight loss, chest pain, body aches, joint swelling, muscle aches, shortness of breath, mood changes, visual or auditory hallucinations.  Objective:    General: Well Developed, well nourished, and in no acute distress.  Neuro: Alert and oriented x3, extra-ocular muscles intact, sensation grossly intact. Cranial nerves II through XII are intact, motor, sensory, and coordinative functions are all intact. HEENT: Normocephalic, atraumatic, pupils equal round reactive to light, neck supple, no masses, no lymphadenopathy, thyroid nonpalpable. Oropharynx, nasopharynx unremarkable, bilateral cerumen impactions Skin: Warm and dry, no rashes noted.  Cardiac: Regular rate and rhythm, no murmurs rubs or gallops.  Respiratory: Clear to auscultation bilaterally. Not using accessory muscles, speaking in full sentences.  Abdominal: Soft, nontender, nondistended, positive bowel sounds, no masses, no organomegaly.  Musculoskeletal: Shoulder, elbow, wrist, hip, knee, ankle stable, and with full range of motion.  Indication: Cerumen impaction of the left and right ear(s) Medical necessity statement: On physical examination, cerumen impairs clinically significant portions of the external auditory canal, and tympanic membrane. Noted  obstructive, copious cerumen that cannot be removed without magnification and instrumentations requiring physician skills Consent: Discussed benefits and risks of procedure and verbal consent obtained Procedure: Patient was prepped for the procedure. Utilized an otoscope to assess and take note of the ear canal, the tympanic membrane, and the presence, amount, and placement of the cerumen. Gentle water irrigation and soft plastic curette was utilized to remove cerumen.  Post procedure examination: shows cerumen was completely removed. Patient tolerated procedure well. The patient is made aware that they may experience temporary vertigo, temporary hearing loss, and temporary discomfort. If these symptom last for more than 24 hours to call the clinic or proceed to the ED.  Impression and Recommendations:    The patient was counselled, risk factors were discussed, anticipatory guidance given.

## 2015-07-17 NOTE — Assessment & Plan Note (Signed)
Resolved with doxycycline  

## 2015-07-17 NOTE — Assessment & Plan Note (Signed)
Complete physical as above. Pap smear is up-to-date, mammogram's every year due to family history.

## 2015-07-17 NOTE — Assessment & Plan Note (Signed)
Has still not started weight loss medication. Kirke Shaggy was not covered, we are going to switch to Victoza. Return in one month.

## 2015-07-19 ENCOUNTER — Encounter: Payer: Self-pay | Admitting: Rehabilitative and Restorative Service Providers"

## 2015-07-19 ENCOUNTER — Ambulatory Visit (INDEPENDENT_AMBULATORY_CARE_PROVIDER_SITE_OTHER): Payer: Managed Care, Other (non HMO) | Admitting: Rehabilitative and Restorative Service Providers"

## 2015-07-19 ENCOUNTER — Encounter (INDEPENDENT_AMBULATORY_CARE_PROVIDER_SITE_OTHER): Payer: Self-pay

## 2015-07-19 DIAGNOSIS — R6889 Other general symptoms and signs: Secondary | ICD-10-CM

## 2015-07-19 DIAGNOSIS — R531 Weakness: Secondary | ICD-10-CM

## 2015-07-19 DIAGNOSIS — Z7409 Other reduced mobility: Secondary | ICD-10-CM | POA: Diagnosis not present

## 2015-07-19 DIAGNOSIS — M623 Immobility syndrome (paraplegic): Secondary | ICD-10-CM | POA: Diagnosis not present

## 2015-07-19 DIAGNOSIS — M2560 Stiffness of unspecified joint, not elsewhere classified: Secondary | ICD-10-CM

## 2015-07-19 DIAGNOSIS — M79601 Pain in right arm: Secondary | ICD-10-CM

## 2015-07-19 DIAGNOSIS — R29898 Other symptoms and signs involving the musculoskeletal system: Secondary | ICD-10-CM | POA: Diagnosis not present

## 2015-07-19 NOTE — Therapy (Signed)
[place], [place], [address] Phone: [phone]   Fax:  [phone]  Physical Therapy Treatment  Patient Details  Name: April [name] MRN: [id number] Date of Birth: 01/19/1971 Referring Provider:  Silverio Decamp,*  Encounter Date: 07/19/2015      PT End of Session - 07/19/15 1554    Visit Number 2   Number of Visits 16   Date for PT Re-Evaluation 09/10/15   PT Start Time 0252   PT Stop Time 0348   PT Time Calculation (min) 56 min   Activity Tolerance Patient tolerated treatment well      History reviewed. No pertinent past medical history.  Past Surgical History  Procedure Laterality Date  . Cholecystectomy    . Cesarean section    . Tubal ligation    . Ablation      There were no vitals filed for this visit.  Visit Diagnosis:  Pain of right upper extremity  Stiffness due to immobility  Weakness of right arm  Decreased strength, endurance, and mobility      Subjective Assessment - 07/19/15 1453    Subjective more pain last night 0 does not know why   Currently in Pain? Yes   Pain Score 1    Pain Location Elbow   Pain Orientation Right   Pain Descriptors / Indicators Nagging  tingling; heaviness                         [facility] Adult PT Treatment/Exercise - 07/19/15 0001    Neck Exercises: Standing   Other Standing Exercises scap squeeze with noodle 10 reps 10 sec hold   Other Standing Exercises chest lift/postural correction   Wrist Exercises   Other wrist exercises extensor forearm stretch 3 reps 20 sec hold with sligh ulnar deviation   Ultrasound   Ultrasound Location extensor forearm   Ultrasound Parameters 1.2 w/cm2; 100%    Ultrasound Goals Pain   Iontophoresis   Type of Iontophoresis Dexamethasone   Location lateral epicondyle   Dose 80 mAmp/min   Time 8 hr   Manual Therapy   Manual therapy comments working through Rt  forearm/elbow/distal arm paitent supine   Soft tissue mobilization forearm extensor muscle group   Myofascial Release forearm   Other Manual Therapy transverse friction massage lateral epicondyle 2 min patient instructed in transverse friction massage   Neural Stretch supine Rt UE at side 1 min 2 reps    Neck Exercises: Stretches   Other Neck Stretches doorway stretch 3 positions 20 sec hold 3 reps each position                PT Education - 07/19/15 1548    Education provided Yes   Education Details postural correction; neural mobilization; transverse friction massage   Person(s) Educated Patient   Methods Explanation;Demonstration;Tactile cues;Verbal cues;Handout   Comprehension Verbalized understanding;Returned demonstration;Verbal cues required;Tactile cues required          PT Short Term Goals - 07/16/15 1410    PT SHORT TERM GOAL #1   Title Patient to demonstrated improved cervical and thoracic posture 08/13/15   Time 4   Period Weeks   Status New   PT SHORT TERM GOAL #2   Title Full cervical and Rt elbow AROM 11/19/14   Time 5   Period Weeks   Status New   PT SHORT TERM GOAL #3   Title Intermittent pain complaints instead of  constant 08/20/15   Time 5   Period Weeks   Status New           PT Long Term Goals - 07/16/15 1412    PT LONG TERM GOAL #1   Title I in HEP for discharge 09/10/15   Time 8   Period Weeks   Status New   PT LONG TERM GOAL #2   Title 5/5 strength Rt wrist extension 09/10/15   Time 8   Period Weeks   Status New   PT LONG TERM GOAL #3   Title Patient verbalizes understanding of work and home ergonomis changes and reports that she has made changes in work and home environments 09/10/15   Time 8   Period Weeks   Status New   PT LONG TERM GOAL #4   Title Decrease FOTO to </= 33% limitation 09/10/15   Time 8   Period Weeks   Status New               Plan - 07/19/15 1555    Pt will benefit from skilled therapeutic  intervention in order to improve on the following deficits Postural dysfunction;Decreased range of motion;Decreased strength;Increased fascial restricitons;Pain   Rehab Potential Good   PT Frequency 2x / week   PT Duration 8 weeks   PT Treatment/Interventions Patient/family education;ADLs/Self Care Home Management;Manual techniques;Therapeutic activities;Therapeutic exercise;Cryotherapy;Electrical Stimulation;Iontophoresis 4mg /ml Dexamethasone;Moist Heat;Ultrasound;Dry needling   PT Next Visit Plan review HEP; continue manual work; progress postural correction and ergonomic modifications; modalities   PT Home Exercise Plan postural correction; work and home modification suggestions;  HEP   Consulted and Agree with Plan of Care Patient        Problem List Patient Active Problem List   Diagnosis Date Noted  . Lateral epicondylitis of right elbow 06/28/2015  . Cellulitis and abscess of trunk 06/28/2015  . Exposure to chemical inhalation 01/08/2015  . Obesity 06/02/2014  . Cervical radiculitis 05/05/2014  . Lumbar radiculitis 05/05/2014  . Actinic keratosis of left cheek 03/17/2013  . Generalized anxiety disorder 03/15/2013  . Insomnia 03/15/2013  . Annual physical exam 03/15/2013    Rosabelle Jupin Nilda Simmer PT, MPH 07/19/2015, 3:57 PM  [facility] [place], [place], [address] Phone: [phone]   Fax:  [phone]

## 2015-07-19 NOTE — Patient Instructions (Signed)
  Neurovascular: Median Nerve Stretch - Supine   Lie with neck supported, side-bent away from moving arm. Hold right arm out to side, elbow bent, thumb down, fingers and wrist bent back. Slowly straighten elbowas far as possible without pain. Hold for __60__ seconds. Repeat __2__ times per set. Do __2 times per day. NO PAIN. Just a stretch    Transverse friction massage - working on muscle just below the elbow  Massage across "speed bump" 2 min stretch  Follow with ice massage 5-7 min

## 2015-07-24 ENCOUNTER — Ambulatory Visit (INDEPENDENT_AMBULATORY_CARE_PROVIDER_SITE_OTHER): Payer: Managed Care, Other (non HMO) | Admitting: Rehabilitative and Restorative Service Providers"

## 2015-07-24 ENCOUNTER — Encounter: Payer: Self-pay | Admitting: Rehabilitative and Restorative Service Providers"

## 2015-07-24 ENCOUNTER — Ambulatory Visit (INDEPENDENT_AMBULATORY_CARE_PROVIDER_SITE_OTHER): Payer: Managed Care, Other (non HMO) | Admitting: Sports Medicine

## 2015-07-24 VITALS — BP 138/83 | HR 92 | Wt 197.0 lb

## 2015-07-24 DIAGNOSIS — M623 Immobility syndrome (paraplegic): Secondary | ICD-10-CM

## 2015-07-24 DIAGNOSIS — R29898 Other symptoms and signs involving the musculoskeletal system: Secondary | ICD-10-CM | POA: Diagnosis not present

## 2015-07-24 DIAGNOSIS — M79601 Pain in right arm: Secondary | ICD-10-CM | POA: Diagnosis not present

## 2015-07-24 DIAGNOSIS — R531 Weakness: Secondary | ICD-10-CM

## 2015-07-24 DIAGNOSIS — Z7409 Other reduced mobility: Secondary | ICD-10-CM | POA: Diagnosis not present

## 2015-07-24 DIAGNOSIS — M2560 Stiffness of unspecified joint, not elsewhere classified: Secondary | ICD-10-CM

## 2015-07-24 DIAGNOSIS — E669 Obesity, unspecified: Secondary | ICD-10-CM | POA: Diagnosis not present

## 2015-07-24 NOTE — Therapy (Signed)
[place], [place], [address] Phone: [phone]   Fax:  [phone]  Physical Therapy Treatment  Patient Details  Name: April [name] MRN: [id number] Date of Birth: Oct 14, 1971 Referring Provider:  Silverio Decamp,*  Encounter Date: 07/24/2015      PT End of Session - 07/24/15 1311    Visit Number 3   Number of Visits 16   Date for PT Re-Evaluation 09/10/15   PT Start Time 0936   PT Stop Time 1018   PT Time Calculation (min) 42 min   Activity Tolerance Patient tolerated treatment well      History reviewed. No pertinent past medical history.  Past Surgical History  Procedure Laterality Date  . Cholecystectomy    . Cesarean section    . Tubal ligation    . Ablation      There were no vitals filed for this visit.  Visit Diagnosis:  Pain of right upper extremity  Stiffness due to immobility  Weakness of right arm  Decreased strength, endurance, and mobility      Subjective Assessment - 07/24/15 0936    Subjective Had a good day after the last visit. Symptoms are now worse again. 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    Currently in Pain? Yes   Pain Score 3    Pain Location Elbow   Pain Orientation Right   Pain Descriptors / Indicators Nagging   Pain Onset More than a month ago                         [facility] Adult PT Treatment/Exercise - 07/24/15 0001    Neck Exercises: Standing   Other Standing Exercises scap squeeze with noodle 10 reps 10 sec hold   Other Standing Exercises chest lift/postural correction   Wrist Exercises   Other wrist exercises extensor forearm stretch 3 reps 20 sec hold with sligh ulnar deviation   Ultrasound   Ultrasound Location extensor forearm   Ultrasound Parameters 1.4 w/cm 2; 1 mHz; 10 min    Ultrasound Goals Pain  muscle tightness   Iontophoresis   Type of Iontophoresis  Dexamethasone   Location lateral epicondyle   Dose 80 mAmp/min   Time 6 hr   Manual Therapy   Manual therapy comments working through Rt forearm/elbow/distal arm paitent supine   Soft tissue mobilization forearm extensor muscle group   Myofascial Release forearm   Other Manual Therapy transverse friction massage lateral epicondyle 2 min patient instructed in transverse friction massage   Neural Stretch supine Rt UE at side 1 min 2 reps    Neck Exercises: Stretches   Other Neck Stretches doorway stretch 3 positions 20 sec hold 3 reps each position                PT Education - 07/24/15 1309    Education provided Yes   Education Details reviewed exercises; corrected transverse friction massage; assessed counterpressure brace/UE support and discussed use of each   Person(s) Educated Patient   Methods Explanation;Demonstration;Verbal cues   Comprehension Verbalized understanding;Returned demonstration;Verbal cues required;Tactile cues required          PT Short Term Goals - 07/16/15 1410    PT SHORT TERM GOAL #1   Title Patient to demonstrated improved cervical and thoracic posture 08/13/15   Time 4   Period Weeks   Status New   PT  SHORT TERM GOAL #2   Title Full cervical and Rt elbow AROM 11/19/14   Time 5   Period Weeks   Status New   PT SHORT TERM GOAL #3   Title Intermittent pain complaints instead of constant 08/20/15   Time 5   Period Weeks   Status New           PT Long Term Goals - 07/16/15 1412    PT LONG TERM GOAL #1   Title I in HEP for discharge 09/10/15   Time 8   Period Weeks   Status New   PT LONG TERM GOAL #2   Title 5/5 strength Rt wrist extension 09/10/15   Time 8   Period Weeks   Status New   PT LONG TERM GOAL #3   Title Patient verbalizes understanding of work and home ergonomis changes and reports that she has made changes in work and home environments 09/10/15   Time 8   Period Weeks   Status New   PT LONG TERM GOAL #4   Title  Decrease FOTO to </= 33% limitation 09/10/15   Time 8   Period Weeks   Status New               Plan - 07/24/15 1311    Clinical Impression Statement Good response to treatment with decrease in symptoms. She has flare up of symptoms with return to normal activities. She has decreased work load/hours and is working on exercises and stretching at home.   Pt will benefit from skilled therapeutic intervention in order to improve on the following deficits Postural dysfunction;Decreased range of motion;Decreased strength;Increased fascial restricitons;Pain   Rehab Potential Good   PT Frequency 2x / week   PT Duration 8 weeks   PT Treatment/Interventions Patient/family education;ADLs/Self Care Home Management;Manual techniques;Therapeutic activities;Therapeutic exercise;Cryotherapy;Electrical Stimulation;Iontophoresis 4mg /ml Dexamethasone;Moist Heat;Ultrasound;Dry needling   PT Next Visit Plan review HEP; continue manual work; progress postural correction and ergonomic modifications; modalities   PT Home Exercise Plan postural correction; work and home modification suggestions;  HEP   Consulted and Agree with Plan of Care Patient        Problem List Patient Active Problem List   Diagnosis Date Noted  . Lateral epicondylitis of right elbow 06/28/2015  . Cellulitis and abscess of trunk 06/28/2015  . Exposure to chemical inhalation 01/08/2015  . Obesity 06/02/2014  . Cervical radiculitis 05/05/2014  . Lumbar radiculitis 05/05/2014  . Actinic keratosis of left cheek 03/17/2013  . Generalized anxiety disorder 03/15/2013  . Insomnia 03/15/2013  . Annual physical exam 03/15/2013    Reyann Troop Nilda Simmer Pt MPH 07/24/2015, 1:14 PM  [facility] [place], [place], [address] Phone: [phone]   Fax:  [phone]

## 2015-07-24 NOTE — Progress Notes (Signed)
Patient came into clinic today for education on administration of Victoza. Pt brought the injection into office today with her, and had her own needles. Went into great detail on administration locations, how to apply/remove needles, how to dial up dose (0.6mg , increased weekly by 0.6mg  until max dose of 1.8mg  reached), administration of injection, and proper disposal of used needles. Stressed the importance of needle safety and went over risk of infection possibilities or contamination that could occur if needles aren't changed after every injection. Pt states she would change the needle each time. Pt was able to administer the injection in office today, no complications. Pt does state her husband is and EMT and he will assist her with the injection administration. No further questions. Advised Pt to contact office if she has any further questions or concerns, verbalized understanding.

## 2015-07-25 LAB — COMPREHENSIVE METABOLIC PANEL
AST: 15 U/L (ref 10–35)
Alkaline Phosphatase: 69 U/L (ref 33–115)
BUN: 12 mg/dL (ref 7–25)
Calcium: 9.4 mg/dL (ref 8.6–10.2)
Chloride: 105 mmol/L (ref 98–110)
Creat: 0.74 mg/dL (ref 0.50–1.10)
Glucose, Bld: 104 mg/dL — ABNORMAL HIGH (ref 65–99)
Potassium: 5 mmol/L (ref 3.5–5.3)
Total Bilirubin: 0.2 mg/dL (ref 0.2–1.2)
Total Protein: 6.9 g/dL (ref 6.1–8.1)

## 2015-07-25 LAB — CBC
HCT: 41.8 % (ref 36.0–46.0)
Hemoglobin: 14 g/dL (ref 12.0–15.0)
MCH: 30.7 pg (ref 26.0–34.0)
MCHC: 33.5 g/dL (ref 30.0–36.0)
MCV: 91.7 fL (ref 78.0–100.0)
MPV: 11.7 fL (ref 8.6–12.4)
Platelets: 240 10*3/uL (ref 150–400)
RBC: 4.56 MIL/uL (ref 3.87–5.11)
RDW: 13.8 % (ref 11.5–15.5)
WBC: 11.2 10*3/uL — ABNORMAL HIGH (ref 4.0–10.5)

## 2015-07-25 LAB — COMPREHENSIVE METABOLIC PANEL WITH GFR
ALT: 13 U/L (ref 6–29)
Albumin: 4.5 g/dL (ref 3.6–5.1)
CO2: 27 mmol/L (ref 20–31)
Sodium: 141 mmol/L (ref 135–146)

## 2015-07-25 LAB — TSH: TSH: 2.564 u[IU]/mL (ref 0.350–4.500)

## 2015-07-25 LAB — LIPID PANEL
Cholesterol: 224 mg/dL — ABNORMAL HIGH (ref 125–200)
HDL: 61 mg/dL (ref >=46)
LDL Cholesterol: 137 mg/dL — ABNORMAL HIGH (ref <130)
Total CHOL/HDL Ratio: 3.7 Ratio (ref <=5.0)
Triglycerides: 128 mg/dL (ref <150)
VLDL: 26 mg/dL (ref <30)

## 2015-07-25 LAB — VITAMIN D 25 HYDROXY (VIT D DEFICIENCY, FRACTURES): Vit D, 25-Hydroxy: 32 ng/mL (ref 30–100)

## 2015-07-25 LAB — HEMOGLOBIN A1C
Hgb A1c MFr Bld: 5.8 % — ABNORMAL HIGH (ref <5.7)
Mean Plasma Glucose: 120 mg/dL — ABNORMAL HIGH (ref <117)

## 2015-07-27 ENCOUNTER — Ambulatory Visit: Payer: Managed Care, Other (non HMO) | Admitting: Sports Medicine

## 2015-07-27 ENCOUNTER — Encounter: Payer: Managed Care, Other (non HMO) | Admitting: Rehabilitative and Restorative Service Providers"

## 2015-08-03 ENCOUNTER — Encounter: Payer: Self-pay | Admitting: Physical Therapy

## 2015-08-03 ENCOUNTER — Ambulatory Visit (INDEPENDENT_AMBULATORY_CARE_PROVIDER_SITE_OTHER): Payer: Managed Care, Other (non HMO) | Admitting: Physical Therapy

## 2015-08-03 DIAGNOSIS — M79601 Pain in right arm: Secondary | ICD-10-CM

## 2015-08-03 DIAGNOSIS — Z7409 Other reduced mobility: Secondary | ICD-10-CM

## 2015-08-03 DIAGNOSIS — M623 Immobility syndrome (paraplegic): Secondary | ICD-10-CM

## 2015-08-03 DIAGNOSIS — R29898 Other symptoms and signs involving the musculoskeletal system: Secondary | ICD-10-CM | POA: Diagnosis not present

## 2015-08-03 DIAGNOSIS — R6889 Other general symptoms and signs: Secondary | ICD-10-CM

## 2015-08-03 DIAGNOSIS — R531 Weakness: Secondary | ICD-10-CM

## 2015-08-03 DIAGNOSIS — M2560 Stiffness of unspecified joint, not elsewhere classified: Secondary | ICD-10-CM

## 2015-08-03 NOTE — Therapy (Addendum)
[address] [place], [place], [address] Phone: [phone]   Fax:  [phone]  Physical Therapy Treatment  Patient Details  Name: April [name] MRN: [id number] Date of Birth: Jun 21, 1971 Referring Provider:  Silverio Decamp,*  Encounter Date: 08/03/2015      PT End of Session - 08/03/15 0949    Visit Number 4   Number of Visits 16   Date for PT Re-Evaluation 09/10/15   PT Start Time 0848   PT Stop Time 1003   PT Time Calculation (min) 75 min   Activity Tolerance Patient tolerated treatment well      History reviewed. No pertinent past medical history.  Past Surgical History  Procedure Laterality Date  . Cholecystectomy    . Cesarean section    . Tubal ligation    . Ablation      There were no vitals filed for this visit.  Visit Diagnosis:  Pain of right upper extremity  Stiffness due to immobility  Weakness of right arm  Decreased strength, endurance, and mobility      Subjective Assessment - 08/03/15 1222    Subjective Pt reports she felt good the rest of the day after her last treatment, however the symptoms returned the next day.  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 Yes   Pain Score 3    Pain Location Elbow   Pain Orientation Right   Pain Descriptors / Indicators Aching;Nagging   Pain Onset More than a month ago   Pain Frequency Constant                         [facility] Adult PT Treatment/Exercise - 08/03/15 0001    Elbow Exercises   Wrist Flexion Right;Seated  stretching    Neck Exercises: Supine   Cervical Isometrics 10 secs  head press on bolster   Other Supine Exercise chest opener stretch on bolster.    Modalities   Modalities Electrical Stimulation;Iontophoresis;Moist Heat;Ultrasound   Moist Heat Therapy   Number Minutes Moist  Heat 15 Minutes   Moist Heat Location Cervical  and upper back   Electrical Stimulation   Electrical Stimulation Location cervical   Electrical Stimulation Action IFC   Electrical Stimulation Parameters to tolerance   Electrical Stimulation Goals Pain   Ultrasound   Ultrasound Location Rt wrist extensors   Ultrasound Parameters 20%, 0.80w/cm2, 3.3 mhz   Ultrasound Goals Pain   Iontophoresis   Type of Iontophoresis Dexamethasone   Location Rt wrist extensors   Dose 80 mAmp/min   Time 6 hr   Manual Therapy   Manual Therapy Soft tissue mobilization;Joint mobilization   Joint Mobilization c- spine mobs CPA and bilat UPA, focus on Rt C6-T1 UPA mobs   Soft tissue mobilization forearm extensor muscle group  cross friction massage, then ice massage                  PT Short Term Goals - 08/03/15 1229    PT SHORT TERM GOAL #1   Title Patient to demonstrated improved cervical and thoracic posture 08/13/15   Status Achieved   PT SHORT TERM GOAL #2   Title Full cervical and Rt elbow AROM 11/19/14   Status On-going   PT SHORT TERM GOAL #3   Title Intermittent pain complaints instead of constant 08/20/15  Status On-going           PT Long Term Goals - 08/03/15 1229    PT LONG TERM GOAL #1   Title I in HEP for discharge 09/10/15   Status On-going   PT LONG TERM GOAL #2   Title 5/5 strength Rt wrist extension 09/10/15   Status On-going   PT LONG TERM GOAL #3   Title Patient verbalizes understanding of work and home ergonomis changes and reports that she has made changes in work and home environments 09/10/15   Status On-going   PT LONG TERM GOAL #4   Title Decrease FOTO to </= 33% limitation 09/10/15   Status On-going               Plan - 08/03/15 1227    Clinical Impression Statement Pt expressed frustration with lack of progress, explained it has only been two weeks.  She will have relief after treatment however symptoms return the next day. She reports  she is working hard on improving and being aware of her posture during daily actiivities.     Pt will benefit from skilled therapeutic intervention in order to improve on the following deficits Postural dysfunction;Decreased range of motion;Decreased strength;Increased fascial restricitons;Pain   Rehab Potential Good   PT Frequency 2x / week   PT Duration 8 weeks   PT Treatment/Interventions Patient/family education;ADLs/Self Care Home Management;Manual techniques;Therapeutic activities;Therapeutic exercise;Cryotherapy;Electrical Stimulation;Iontophoresis 68m/ml Dexamethasone;Moist Heat;Ultrasound;Dry needling   PT Next Visit Plan review HEP; continue manual work; progress postural correction and ergonomic modifications; modalities   Consulted and Agree with Plan of Care Patient        Problem List Patient Active Problem List   Diagnosis Date Noted  . Lateral epicondylitis of right elbow 06/28/2015  . Cellulitis and abscess of trunk 06/28/2015  . Exposure to chemical inhalation 01/08/2015  . Obesity 06/02/2014  . Cervical radiculitis 05/05/2014  . Lumbar radiculitis 05/05/2014  . Actinic keratosis of left cheek 03/17/2013  . Generalized anxiety disorder 03/15/2013  . Insomnia 03/15/2013  . Annual physical exam 03/15/2013    SJeral PinchPT  08/03/2015, 12:30 PM  [facility] [address]Phone: [phone]  Fax:  [phone]    PHYSICAL THERAPY DISCHARGE SUMMARY  Visits from Start of Care: 4  Current functional level related to goals / functional outcomes: unknown   Remaining deficits: unknown   Education / Equipment: HEP Plan: Patient agrees to discharge.  Patient goals were partially met. Patient is being discharged due to financial reasons.  ?????       SJeral Pinch PT 09/07/2015 7:57 AM

## 2015-08-08 ENCOUNTER — Encounter: Payer: Managed Care, Other (non HMO) | Admitting: Physical Therapy

## 2015-08-14 ENCOUNTER — Ambulatory Visit: Payer: Managed Care, Other (non HMO) | Admitting: Sports Medicine

## 2015-10-01 ENCOUNTER — Other Ambulatory Visit: Payer: Self-pay | Admitting: Sports Medicine

## 2015-11-15 ENCOUNTER — Ambulatory Visit (INDEPENDENT_AMBULATORY_CARE_PROVIDER_SITE_OTHER): Payer: Managed Care, Other (non HMO) | Admitting: Sports Medicine

## 2015-11-15 VITALS — BP 127/80 | HR 92 | Wt 194.0 lb

## 2015-11-15 DIAGNOSIS — L719 Rosacea, unspecified: Secondary | ICD-10-CM | POA: Diagnosis not present

## 2015-11-15 DIAGNOSIS — R41840 Attention and concentration deficit: Secondary | ICD-10-CM | POA: Diagnosis not present

## 2015-11-15 DIAGNOSIS — F909 Attention-deficit hyperactivity disorder, unspecified type: Secondary | ICD-10-CM | POA: Insufficient documentation

## 2015-11-15 DIAGNOSIS — E669 Obesity, unspecified: Secondary | ICD-10-CM | POA: Diagnosis not present

## 2015-11-15 MED ORDER — EXENATIDE ER 2 MG ~~LOC~~ PEN: 2.0000 mg | PEN_INJECTOR | SUBCUTANEOUS | Status: DC

## 2015-11-15 MED ORDER — METRONIDAZOLE 0.75 % EX GEL: 1.0000 "application " | Freq: Two times a day (BID) | CUTANEOUS | Status: DC

## 2015-11-15 NOTE — Assessment & Plan Note (Signed)
Lost some weight using a friend's Adderall however we do need to restart GLP-1 agonist treatment.

## 2015-11-15 NOTE — Assessment & Plan Note (Signed)
Referral to neuropsychology. We discussed the mechanism of both stimulant and non-stimulant ADHD medications. We will start with a non-stimulant. She did take some of her friend's Adderall for a while.

## 2015-11-15 NOTE — Progress Notes (Signed)
  Subjective:    CC: multiple complaints  HPI: Obesity: Has self discontinued her Victoza. Agreeable to start a different medication, Victoza was no longer covered by her insurance.  Difficulty concentrating: [place], notes increasing difficulty with staying on task, focusing, she feels scatterbrain, she also feels as though she can read an entire page or an entire but cannot remember anything. She did take some of her friend's Adderall which seemed to help. Wonders if she has ADHD.  Rosacea: Needs a refill on topical cream.  Past medical history, Surgical history, Family history not pertinant except as noted below, Social history, Allergies, and medications have been entered into the medical record, reviewed, and no changes needed.   Review of Systems: No fevers, chills, night sweats, weight loss, chest pain, or shortness of breath.   Objective:    General: Well Developed, well nourished, and in no acute distress.  Neuro: Alert and oriented x3, extra-ocular muscles intact, sensation grossly intact.  HEENT: Normocephalic, atraumatic, pupils equal round reactive to light, neck supple, no masses, no lymphadenopathy, thyroid nonpalpable.  Skin: Warm and dry, no rashes. Cardiac: Regular rate and rhythm, no murmurs rubs or gallops, no lower extremity edema.  Respiratory: Clear to auscultation bilaterally. Not using accessory muscles, speaking in full sentences.  Impression and Recommendations:    I spent 40 minutes with this patient, greater than 50% was face-to-face time counseling regarding the above diagnoses

## 2015-11-15 NOTE — Assessment & Plan Note (Signed)
Adding MetroGel.

## 2015-11-16 ENCOUNTER — Telehealth: Payer: Self-pay | Admitting: Sports Medicine

## 2015-11-16 NOTE — Telephone Encounter (Signed)
Patient is scheduled with Dr. Irven Shelling on 12/17/15 and is aware of the appointment. - CF

## 2015-12-07 ENCOUNTER — Ambulatory Visit: Payer: Managed Care, Other (non HMO)

## 2015-12-12 ENCOUNTER — Encounter: Payer: Self-pay | Admitting: Sports Medicine

## 2015-12-12 ENCOUNTER — Ambulatory Visit (INDEPENDENT_AMBULATORY_CARE_PROVIDER_SITE_OTHER): Payer: Managed Care, Other (non HMO)

## 2015-12-12 ENCOUNTER — Ambulatory Visit (INDEPENDENT_AMBULATORY_CARE_PROVIDER_SITE_OTHER): Payer: Managed Care, Other (non HMO) | Admitting: Sports Medicine

## 2015-12-12 ENCOUNTER — Telehealth: Payer: Self-pay

## 2015-12-12 VITALS — BP 133/75 | HR 109 | Temp 98.0°F | Resp 18 | Wt 194.8 lb

## 2015-12-12 DIAGNOSIS — L719 Rosacea, unspecified: Secondary | ICD-10-CM

## 2015-12-12 DIAGNOSIS — E669 Obesity, unspecified: Secondary | ICD-10-CM

## 2015-12-12 DIAGNOSIS — R059 Cough, unspecified: Secondary | ICD-10-CM | POA: Insufficient documentation

## 2015-12-12 DIAGNOSIS — R05 Cough: Secondary | ICD-10-CM | POA: Diagnosis not present

## 2015-12-12 DIAGNOSIS — R0989 Other specified symptoms and signs involving the circulatory and respiratory systems: Secondary | ICD-10-CM | POA: Diagnosis not present

## 2015-12-12 MED ORDER — BENZONATATE 200 MG PO CAPS: 200.0000 mg | ORAL_CAPSULE | Freq: Three times a day (TID) | ORAL | Status: DC | PRN

## 2015-12-12 MED ORDER — FLUTICASONE PROPIONATE 50 MCG/ACT NA SUSP: NASAL | Status: DC

## 2015-12-12 MED ORDER — FINACEA PLUS 15 % EX KIT: PACK | CUTANEOUS | Status: DC

## 2015-12-12 NOTE — Assessment & Plan Note (Signed)
Injection education today.

## 2015-12-12 NOTE — Telephone Encounter (Signed)
Pt is questioning if she is contagious?  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   She

## 2015-12-12 NOTE — Assessment & Plan Note (Signed)
Persistent for 6 weeks now, adding Flonase, Tessalon Perles, chest x-ray. Exam is benign with the exception of some edema in the nasal turbinates.

## 2015-12-12 NOTE — Telephone Encounter (Signed)
Called patient, no answer.  Left on voicemail that it is unlikely that she is contagious, and if not feeling better in 1 week or sooner if symptoms become worse, to let us know.

## 2015-12-12 NOTE — Progress Notes (Signed)
  Subjective:    CC: Cough  HPI: Cough: This is a pleasant 45 year old female, she tells me for the past 6 weeks she's had a wet sounding but minimally productive cough, runny nose, no muscle aches, body aches, fevers, chills, no sinus pressure, no GI symptoms, no rash. No constitutional symptoms.  Obesity: Here for Bydureon education as well.  Rosacea: Desires to switch to a different gel  Past medical history, Surgical history, Family history not pertinant except as noted below, Social history, Allergies, and medications have been entered into the medical record, reviewed, and no changes needed.   Review of Systems: No fevers, chills, night sweats, weight loss, chest pain, or shortness of breath.   Objective:    General: Well Developed, well nourished, and in no acute distress.  Neuro: Alert and oriented x3, extra-ocular muscles intact, sensation grossly intact.  HEENT: Normocephalic, atraumatic, pupils equal round reactive to light, neck supple, no masses, no lymphadenopathy, thyroid nonpalpable. Oropharynx, ear canals unremarkable, nasopharynx is boggy and erythematous turbinate bones. Skin: Warm and dry, no rashes. Cardiac: Regular rate and rhythm, no murmurs rubs or gallops, no lower extremity edema.  Respiratory: Clear to auscultation bilaterally. Not using accessory muscles, speaking in full sentences  Impression and Recommendations:

## 2015-12-12 NOTE — Assessment & Plan Note (Signed)
Switching to [place].

## 2015-12-12 NOTE — Telephone Encounter (Signed)
Unlikely since there is no fever.

## 2015-12-17 ENCOUNTER — Ambulatory Visit (INDEPENDENT_AMBULATORY_CARE_PROVIDER_SITE_OTHER): Payer: Managed Care, Other (non HMO) | Admitting: Psychology

## 2015-12-17 DIAGNOSIS — F902 Attention-deficit hyperactivity disorder, combined type: Secondary | ICD-10-CM | POA: Diagnosis not present

## 2015-12-20 ENCOUNTER — Telehealth: Payer: Self-pay

## 2015-12-20 NOTE — Telephone Encounter (Signed)
Please have her come back and let us set her up for a pre-and postbronchodilator spirometry visit.

## 2015-12-20 NOTE — Telephone Encounter (Signed)
April [name] called and reports the cough is not better. She was advised to call back if she didn't get better. Please advise.

## 2015-12-24 ENCOUNTER — Encounter: Payer: Self-pay | Admitting: Osteopathic Medicine

## 2015-12-24 ENCOUNTER — Ambulatory Visit (INDEPENDENT_AMBULATORY_CARE_PROVIDER_SITE_OTHER): Payer: Managed Care, Other (non HMO)

## 2015-12-24 ENCOUNTER — Ambulatory Visit (INDEPENDENT_AMBULATORY_CARE_PROVIDER_SITE_OTHER): Payer: Managed Care, Other (non HMO) | Admitting: Osteopathic Medicine

## 2015-12-24 VITALS — BP 135/86 | HR 109 | Temp 98.1°F | Ht 65.0 in | Wt 194.0 lb

## 2015-12-24 DIAGNOSIS — Z720 Tobacco use: Secondary | ICD-10-CM | POA: Diagnosis not present

## 2015-12-24 DIAGNOSIS — R053 Chronic cough: Secondary | ICD-10-CM

## 2015-12-24 DIAGNOSIS — R05 Cough: Secondary | ICD-10-CM

## 2015-12-24 DIAGNOSIS — J209 Acute bronchitis, unspecified: Secondary | ICD-10-CM

## 2015-12-24 MED ORDER — GUAIFENESIN-CODEINE 100-10 MG/5ML PO SOLN
5.0000 mL | Freq: Four times a day (QID) | ORAL | Status: DC | PRN
Start: 2015-12-24 — End: 2016-01-10

## 2015-12-24 MED ORDER — METHYLPREDNISOLONE 4 MG PO TBPK: ORAL_TABLET | ORAL | Status: DC

## 2015-12-24 MED ORDER — AZITHROMYCIN 250 MG PO TABS: ORAL_TABLET | ORAL | Status: DC

## 2015-12-24 NOTE — Telephone Encounter (Signed)
Pt informed of test and was transferred to scheduling for an appointment

## 2015-12-24 NOTE — Patient Instructions (Signed)
If breathing/cough no better, follow up with Dr. Darene Lamer in one - two weeks

## 2015-12-24 NOTE — Progress Notes (Signed)
HPI: April [name] is a 45 y.o. female who presents to [facility]  today for chief complaint of:  Chief Complaint  Patient presents with  . Cough   ACUTE ILLNESS/URI . Location: chest . Quality: cough, congestion . Duration: few months . Modifying factors: has tried the following medications: see below  with relief  Context: seen 12/12/15 (week and a half ago) for similar, CXR no PNA but (+) bronchitic changes noted, Dr Darene Lamer added Asencion Islam, Tessalon. Works as Research officer, political party, Counsellor. Occasional productive cough.    Past medical, social and family history reviewed: No past medical history on file. Past Surgical History  Procedure Laterality Date  . Cholecystectomy    . Cesarean section    . Tubal ligation    . Ablation     Social History  Substance Use Topics  . Smoking status: Former Research scientist (life sciences)  . Smokeless tobacco: Not on file  . Alcohol Use: Yes     Comment: 2-4 q wk   Family History  Problem Relation Age of Onset  . Cancer Maternal Aunt     Current Outpatient Prescriptions  Medication Sig Dispense Refill  . Azelaic Acid-Cleanser-Lotion (FINACEA PLUS) 15 % KIT Using as directed twice a day 1 kit 11  . benzonatate (TESSALON) 200 MG capsule Take 1 capsule (200 mg total) by mouth 3 (three) times daily as needed for cough. 45 capsule 0  . citalopram (CELEXA) 10 MG tablet Take 10 mg by mouth daily.    . Exenatide ER (BYDUREON) 2 MG PEN Inject 2 mg into the skin once a week. 4 each 3  . fluticasone (FLONASE) 50 MCG/ACT nasal spray One spray in each nostril twice a day, use left hand for right nostril, and right hand for left nostril. 48 g 3  . meloxicam (MOBIC) 15 MG tablet TAKE 1 TABLET (15 MG TOTAL) BY MOUTH DAILY. 90 tablet 0   No current facility-administered medications for this visit.   No Known Allergies    Review of Systems: CONSTITUTIONAL: no fever/chills HEAD/EYES/EARS/NOSE/THROAT: no headache, no vision change or  hearing change, no sore throat CARDIAC: No chest pain/pressure/palpitations, no orthopnea RESPIRATORY: yes cough, no shortness of breath GASTROINTESTINAL: no nausea, no vomiting, no abdominal pain/blood in stool/diarrhea/constipation MUSCULOSKELETAL: no myalgia/arthralgia   Exam:  BP 135/86 mmHg  Pulse 109  Temp(Src) 98.1 F (36.7 C)  Ht '5\' 5"'  (1.651 m)  Wt 194 lb (87.998 kg)  BMI 32.28 kg/m2 Constitutional: VSS, see above. General Appearance: alert, well-developed, well-nourished, NAD Eyes: Normal lids and conjunctive, non-icteric sclera, PERRLA Ears, Nose, Mouth, Throat: Normal external inspection ears/nares/mouth/lips/gums, normal TM, MMM; posterior pharynx without erythema, without exudate Neck: No masses, trachea midline. No thyroid enlargement/tenderness/mass appreciated, normal lymph nodes Respiratory: Normal respiratory effort. No  wheeze/rhonchi/rales Cardiovascular: S1/S2 normal, no murmur/rub/gallop auscultated. RRR. No carotid bruit or JVD. No lower extremity edema.  Dg Chest 2 View  12/24/2015  CLINICAL DATA:  Bronchitis.  Cough. EXAM: CHEST  2 VIEW COMPARISON:  12/12/2015. FINDINGS: Mediastinum hilar structures normal. Heart size normal. No focal infiltrate. Mild left base pleural parenchymal thickening again noted. Findings consistent with scarring. No pleural effusion or pneumothorax. No acute bony abnormality. IMPRESSION: No acute cardiopulmonary disease. Mild left base pleural parenchymal thickening again noted. Findings consistent with scarring . Electronically Signed   By: Marcello Moores  Register   On: 12/24/2015 16:32   CXR personally reviewed w/ patient, no acute disease noted, no significant change from previous image.    ASSESSMENT/PLAN:  Pt denies rhinosinusitis or GERD symptoms, no Hx asthma. 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 Will trial Abx to cover atypicals, trial steroids, continue cough meds, advised STOP SMOKING. If no improvement would consider CT chest, PFT, other w/u per her PCP.   Chronic cough - Plan: DG Chest 2 View, methylPREDNISolone (MEDROL DOSEPAK) 4 MG TBPK tablet  Tobacco abuse  Acute bronchitis, unspecified organism - Plan: azithromycin (ZITHROMAX) 250 MG tablet    Return if symptoms worsen or fail to improve.

## 2016-01-07 ENCOUNTER — Ambulatory Visit: Payer: Managed Care, Other (non HMO) | Admitting: Psychology

## 2016-01-10 ENCOUNTER — Encounter: Payer: Self-pay | Admitting: Sports Medicine

## 2016-01-10 ENCOUNTER — Ambulatory Visit (INDEPENDENT_AMBULATORY_CARE_PROVIDER_SITE_OTHER): Payer: Managed Care, Other (non HMO) | Admitting: Sports Medicine

## 2016-01-10 VITALS — BP 136/91 | HR 99 | Temp 98.9°F | Resp 18 | Wt 191.0 lb

## 2016-01-10 DIAGNOSIS — F900 Attention-deficit hyperactivity disorder, predominantly inattentive type: Secondary | ICD-10-CM

## 2016-01-10 DIAGNOSIS — E669 Obesity, unspecified: Secondary | ICD-10-CM

## 2016-01-10 DIAGNOSIS — F411 Generalized anxiety disorder: Secondary | ICD-10-CM

## 2016-01-10 DIAGNOSIS — F909 Attention-deficit hyperactivity disorder, unspecified type: Secondary | ICD-10-CM

## 2016-01-10 DIAGNOSIS — R05 Cough: Secondary | ICD-10-CM

## 2016-01-10 DIAGNOSIS — L719 Rosacea, unspecified: Secondary | ICD-10-CM | POA: Diagnosis not present

## 2016-01-10 DIAGNOSIS — R059 Cough, unspecified: Secondary | ICD-10-CM

## 2016-01-10 MED ORDER — METHYLPHENIDATE HCL ER (OSM) 27 MG PO TBCR: 27.0000 mg | EXTENDED_RELEASE_TABLET | ORAL | Status: DC

## 2016-01-10 MED ORDER — CITALOPRAM HYDROBROMIDE 20 MG PO TABS: 20.0000 mg | ORAL_TABLET | Freq: Every day | ORAL | Status: DC

## 2016-01-10 NOTE — Assessment & Plan Note (Addendum)
April [name] is working well.

## 2016-01-10 NOTE — Assessment & Plan Note (Signed)
Increasing citalopram to 20 mg.

## 2016-01-10 NOTE — Assessment & Plan Note (Signed)
Starting mid dose Concerta.

## 2016-01-10 NOTE — Assessment & Plan Note (Signed)
Patient will work aggressively on smoking cessation, if insufficient response with patches and gum, we will add Chantix at the next visit and consider PFTs

## 2016-01-10 NOTE — Assessment & Plan Note (Signed)
Continue injection, adding Concerta.

## 2016-01-10 NOTE — Progress Notes (Signed)
  Subjective:    CC: "Follow up for ADHD"  HPI: Patient is a 45 year old female that is presenting for follow up rosacea, Cough, ADHD, social anxiety and weight loss. Patient states that her rosacea is improving with her current regimen. 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 Patient states that she successfully underwent testing with neuropsychology which revealed a diagnosis of ADHD and social anxiety. Patient states that she is pleased with her three pound weight loss since 12/24/15. She is interested in discussing ADHD medication options that might also facilitate weight loss. Patient has otherwise been well.   Past medical history, Surgical history, Family history not pertinant except as noted below, Social history, Allergies, and medications have been entered into the medical record, reviewed, and no changes needed.   Review of Systems: No fevers, chills, night sweats, weight loss, chest pain, or shortness of breath.   Objective:    General: Well Developed, well nourished, and in no acute distress.  Neuro: Alert and oriented x3, extra-ocular muscles intact, sensation grossly intact.  HEENT: Normocephalic, atraumatic, pupils equal round reactive to light, neck supple, no masses, no lymphadenopathy, thyroid nonpalpable.  Skin: Warm and dry, no rashes. Cardiac: Regular rate and rhythm, no murmurs rubs or gallops, no lower extremity edema.  Respiratory: Clear to auscultation bilaterally. Not using accessory muscles, speaking in full sentences. Abdominal: Patient's abdomen is soft and non-tender. Patient has no guarding rigidity or rebound tenderness. Patient has positive bowel sounds in all four quadrants.   Impression and Recommendations:    1. ADHD  Patient will be started on Concerta for ADHD. Patient education was provided regarding the possible  side effects of medication.   2. Rosacea  Improving. Patient will continue with current regimen.   3. Smoking Cessation  Patient currently smokes 1/2 pack of cigarettes a day. Patient was advised to try a combination of over the counter nicotine gum and patches for smoking cessation.   4. Social Anxiety Patient's Citalopram was increased to 20 mg for social anxiety.   5. Cough Patient continues to have persistent cough. Patient was advised to make a follow up appointment to undergo pulmonary function testing.   6. Abnormal weight gain Patient has lost three pounds since 12/24/2015. Patient will continue taking Exenatide.

## 2016-02-06 ENCOUNTER — Ambulatory Visit: Payer: Self-pay | Admitting: Sports Medicine

## 2016-02-11 ENCOUNTER — Ambulatory Visit: Payer: Self-pay | Admitting: Sports Medicine

## 2016-02-12 ENCOUNTER — Ambulatory Visit (INDEPENDENT_AMBULATORY_CARE_PROVIDER_SITE_OTHER): Payer: Managed Care, Other (non HMO) | Admitting: Sports Medicine

## 2016-02-12 ENCOUNTER — Encounter: Payer: Self-pay | Admitting: Sports Medicine

## 2016-02-12 VITALS — BP 129/85 | HR 93 | Resp 18 | Wt 186.2 lb

## 2016-02-12 DIAGNOSIS — Z720 Tobacco use: Secondary | ICD-10-CM

## 2016-02-12 DIAGNOSIS — F17200 Nicotine dependence, unspecified, uncomplicated: Secondary | ICD-10-CM | POA: Insufficient documentation

## 2016-02-12 DIAGNOSIS — F900 Attention-deficit hyperactivity disorder, predominantly inattentive type: Secondary | ICD-10-CM | POA: Diagnosis not present

## 2016-02-12 DIAGNOSIS — F411 Generalized anxiety disorder: Secondary | ICD-10-CM | POA: Diagnosis not present

## 2016-02-12 DIAGNOSIS — G4700 Insomnia, unspecified: Secondary | ICD-10-CM

## 2016-02-12 DIAGNOSIS — F909 Attention-deficit hyperactivity disorder, unspecified type: Secondary | ICD-10-CM

## 2016-02-12 MED ORDER — METHYLPHENIDATE HCL ER (OSM) 36 MG PO TBCR: 36.0000 mg | EXTENDED_RELEASE_TABLET | ORAL | Status: DC

## 2016-02-12 MED ORDER — VARENICLINE TARTRATE 0.5 MG X 11 & 1 MG X 42 PO MISC: ORAL | Status: DC

## 2016-02-12 NOTE — Progress Notes (Signed)
  Subjective:    CC: follow-up  HPI: Obesity: Good weight loss, she is off of all of her weight loss medications, this is simply from her Concerta.  Attention deficit disorder: Doing well on Concerta 27 mg, get some weaning of affected the end of the day.  Smoker: Still smoking however much improved with nicotine replacement, agreeable to try Chantix today.  Insomnia: Improved significantly with increasing Celexa.  Generalized anxiety: Controlled now on Celexa.  Past medical history, Surgical history, Family history not pertinant except as noted below, Social history, Allergies, and medications have been entered into the medical record, reviewed, and no changes needed.   Review of Systems: No fevers, chills, night sweats, weight loss, chest pain, or shortness of breath.   Objective:    General: Well Developed, well nourished, and in no acute distress.  Neuro: Alert and oriented x3, extra-ocular muscles intact, sensation grossly intact.  HEENT: Normocephalic, atraumatic, pupils equal round reactive to light, neck supple, no masses, no lymphadenopathy, thyroid nonpalpable.  Skin: Warm and dry, no rashes. Cardiac: Regular rate and rhythm, no murmurs rubs or gallops, no lower extremity edema.  Respiratory: Clear to auscultation bilaterally. Not using accessory muscles, speaking in full sentences.  Impression and Recommendations:

## 2016-02-12 NOTE — Assessment & Plan Note (Signed)
Overall good effect but some waning of efficacy in the end of the day. Increasing Concerta 36 mg.

## 2016-02-12 NOTE — Assessment & Plan Note (Signed)
Improved significantly with increasing Celexa

## 2016-02-12 NOTE — Assessment & Plan Note (Signed)
Improved but still smoking despite nicotine replacement. Adding Chantix.

## 2016-02-12 NOTE — Assessment & Plan Note (Signed)
Doing extremely well with Celexa 20.

## 2016-03-11 ENCOUNTER — Encounter: Payer: Self-pay | Admitting: Sports Medicine

## 2016-03-11 ENCOUNTER — Ambulatory Visit (INDEPENDENT_AMBULATORY_CARE_PROVIDER_SITE_OTHER): Payer: Managed Care, Other (non HMO) | Admitting: Sports Medicine

## 2016-03-11 VITALS — BP 129/87 | HR 98 | Resp 18 | Wt 184.0 lb

## 2016-03-11 DIAGNOSIS — L719 Rosacea, unspecified: Secondary | ICD-10-CM | POA: Diagnosis not present

## 2016-03-11 DIAGNOSIS — Z720 Tobacco use: Secondary | ICD-10-CM

## 2016-03-11 DIAGNOSIS — F900 Attention-deficit hyperactivity disorder, predominantly inattentive type: Secondary | ICD-10-CM | POA: Diagnosis not present

## 2016-03-11 DIAGNOSIS — F17200 Nicotine dependence, unspecified, uncomplicated: Secondary | ICD-10-CM

## 2016-03-11 DIAGNOSIS — F909 Attention-deficit hyperactivity disorder, unspecified type: Secondary | ICD-10-CM

## 2016-03-11 MED ORDER — METHYLPHENIDATE HCL ER (OSM) 36 MG PO TBCR: 36.0000 mg | EXTENDED_RELEASE_TABLET | ORAL | Status: DC

## 2016-03-11 NOTE — Assessment & Plan Note (Signed)
Chantix was over $300 at her pharmacy, we are going to do a discount coupon this time.

## 2016-03-11 NOTE — Assessment & Plan Note (Signed)
Fantastic response to Concerta 36.

## 2016-03-11 NOTE — Assessment & Plan Note (Signed)
At this point has failed topical metronidazole and Finacea cream, referral to dermatology for consideration of phototherapy.

## 2016-03-11 NOTE — Progress Notes (Signed)
  Subjective:    CC: Follow-up  HPI: Rosacea: Inadequate response is now to topical metronidazole and topical Finacea, agreeable with dermatology referral.  Attention deficit disorder: Fantastic response to Concerta 36.  Smoker: Unable to afford Chantix, did not get a discount coupon.  Past medical history, Surgical history, Family history not pertinant except as noted below, Social history, Allergies, and medications have been entered into the medical record, reviewed, and no changes needed.   Review of Systems: No fevers, chills, night sweats, weight loss, chest pain, or shortness of breath.   Objective:    General: Well Developed, well nourished, and in no acute distress.  Neuro: Alert and oriented x3, extra-ocular muscles intact, sensation grossly intact.  HEENT: Normocephalic, atraumatic, pupils equal round reactive to light, neck supple, no masses, no lymphadenopathy, thyroid nonpalpable.  Skin: Warm and dry, no rashes. Cardiac: Regular rate and rhythm, no murmurs rubs or gallops, no lower extremity edema.  Respiratory: Clear to auscultation bilaterally. Not using accessory muscles, speaking in full sentences.  Impression and Recommendations:   I spent 25 minutes with this patient, greater than 50% was face-to-face time counseling regarding the above diagnoses

## 2016-04-10 ENCOUNTER — Other Ambulatory Visit: Payer: Self-pay

## 2016-04-10 DIAGNOSIS — F909 Attention-deficit hyperactivity disorder, unspecified type: Secondary | ICD-10-CM

## 2016-04-10 MED ORDER — METHYLPHENIDATE HCL ER (OSM) 36 MG PO TBCR: 36.0000 mg | EXTENDED_RELEASE_TABLET | ORAL | Status: DC

## 2016-04-11 ENCOUNTER — Telehealth: Payer: Self-pay | Admitting: Sports Medicine

## 2016-04-11 DIAGNOSIS — F17200 Nicotine dependence, unspecified, uncomplicated: Secondary | ICD-10-CM

## 2016-04-11 MED ORDER — BUPROPION HCL ER (XL) 150 MG PO TB24: ORAL_TABLET | ORAL | Status: DC

## 2016-04-11 NOTE — Assessment & Plan Note (Signed)
Chantix was too expensive, switching to Wellbutrin.

## 2016-04-11 NOTE — Telephone Encounter (Signed)
Done

## 2016-04-11 NOTE — Telephone Encounter (Signed)
Patient wants to try wellbutrin to stop smoking...chantix is too expensive.  Please send the [facility] to [facility] [address].  thanks

## 2016-05-14 ENCOUNTER — Other Ambulatory Visit: Payer: Self-pay

## 2016-05-14 DIAGNOSIS — F909 Attention-deficit hyperactivity disorder, unspecified type: Secondary | ICD-10-CM

## 2016-05-14 DIAGNOSIS — F411 Generalized anxiety disorder: Secondary | ICD-10-CM

## 2016-05-14 MED ORDER — METHYLPHENIDATE HCL ER (OSM) 36 MG PO TBCR: 36.0000 mg | EXTENDED_RELEASE_TABLET | ORAL | Status: DC

## 2016-05-14 MED ORDER — CITALOPRAM HYDROBROMIDE 20 MG PO TABS: 20.0000 mg | ORAL_TABLET | Freq: Every day | ORAL | Status: DC

## 2016-06-10 ENCOUNTER — Ambulatory Visit (INDEPENDENT_AMBULATORY_CARE_PROVIDER_SITE_OTHER): Payer: Managed Care, Other (non HMO) | Admitting: Sports Medicine

## 2016-06-10 ENCOUNTER — Encounter: Payer: Self-pay | Admitting: Sports Medicine

## 2016-06-10 DIAGNOSIS — R490 Dysphonia: Secondary | ICD-10-CM

## 2016-06-10 DIAGNOSIS — F411 Generalized anxiety disorder: Secondary | ICD-10-CM

## 2016-06-10 DIAGNOSIS — L719 Rosacea, unspecified: Secondary | ICD-10-CM

## 2016-06-10 MED ORDER — CITALOPRAM HYDROBROMIDE 20 MG PO TABS: 30.0000 mg | ORAL_TABLET | Freq: Every day | ORAL | 11 refills | Status: DC

## 2016-06-10 MED ORDER — IVERMECTIN 1 % EX CREA: 1.0000 "application " | TOPICAL_CREAM | Freq: Every day | CUTANEOUS | 11 refills | Status: DC

## 2016-06-10 NOTE — Assessment & Plan Note (Signed)
Does a lot of spray tanning. I have asked her to wear respiratory protection, she is also a smoker. Minimal acid reflux type symptoms. If insufficient improvement by the next visit we will refer for direct visualization.

## 2016-06-10 NOTE — Assessment & Plan Note (Signed)
Increasing celexa to 30mg  and referral to counselling.

## 2016-06-10 NOTE — Progress Notes (Signed)
  Subjective:    CC: Follow-up  HPI: ADHD: Well controlled on Concerta 36.  Rosacea: There is telangiectatic, desires to switch to a different cream.  Hoarseness: Does spray tan through the day, worried that she is having excessive occupational exposure, no wheezing, cough, shortness of breath. No constitutional symptoms. She is a smoker.  Past medical history, Surgical history, Family history not pertinant except as noted below, Social history, Allergies, and medications have been entered into the medical record, reviewed, and no changes needed.   Review of Systems: No fevers, chills, night sweats, weight loss, chest pain, or shortness of breath.   Objective:    General: Well Developed, well nourished, and in no acute distress.  Neuro: Alert and oriented x3, extra-ocular muscles intact, sensation grossly intact.  HEENT: Normocephalic, atraumatic, pupils equal round reactive to light, neck supple, no masses, no lymphadenopathy, thyroid nonpalpable.  Skin: Warm and dry, no rashes.  Minimal telangiectasias on the cheeks. Cardiac: Regular rate and rhythm, no murmurs rubs or gallops, no lower extremity edema.  Respiratory: Clear to auscultation bilaterally. Not using accessory muscles, speaking in full sentences.  Impression and Recommendations:    I spent 25 minutes with this patient, greater than 50% was face-to-face time counseling regarding the above diagnoses

## 2016-06-10 NOTE — Assessment & Plan Note (Signed)
Switching from Finacea to ivermectin per patient request

## 2016-07-04 ENCOUNTER — Other Ambulatory Visit: Payer: Self-pay

## 2016-07-04 DIAGNOSIS — F909 Attention-deficit hyperactivity disorder, unspecified type: Secondary | ICD-10-CM

## 2016-07-04 MED ORDER — METHYLPHENIDATE HCL ER (OSM) 36 MG PO TBCR: 36.0000 mg | EXTENDED_RELEASE_TABLET | ORAL | 0 refills | Status: DC

## 2016-07-15 ENCOUNTER — Ambulatory Visit: Payer: Self-pay | Admitting: Sports Medicine

## 2016-07-16 ENCOUNTER — Ambulatory Visit: Payer: Self-pay | Admitting: Sports Medicine

## 2016-08-05 ENCOUNTER — Other Ambulatory Visit: Payer: Self-pay

## 2016-08-05 DIAGNOSIS — F909 Attention-deficit hyperactivity disorder, unspecified type: Secondary | ICD-10-CM

## 2016-08-05 MED ORDER — METHYLPHENIDATE HCL ER (OSM) 36 MG PO TBCR: 36.0000 mg | EXTENDED_RELEASE_TABLET | ORAL | 0 refills | Status: DC

## 2016-08-11 ENCOUNTER — Telehealth: Payer: Self-pay

## 2016-08-11 NOTE — Telephone Encounter (Signed)
Pt left VM stating she was recently seen for a sinus infection but is not feeling better after antibiotics actually states symptoms are coming back. Would like to know what she should do next. Please assist.

## 2016-08-11 NOTE — Telephone Encounter (Signed)
Where was she seen and what was done to treat it?  Since I haven't seen her for it, I would like to confirm the diagnosis myself, she should see me.

## 2016-08-11 NOTE — Telephone Encounter (Signed)
Patient  notified and will call back to schedule appt

## 2016-08-15 ENCOUNTER — Ambulatory Visit (INDEPENDENT_AMBULATORY_CARE_PROVIDER_SITE_OTHER): Payer: Managed Care, Other (non HMO) | Admitting: Osteopathic Medicine

## 2016-08-15 ENCOUNTER — Encounter: Payer: Self-pay | Admitting: Osteopathic Medicine

## 2016-08-15 DIAGNOSIS — S86811A Strain of other muscle(s) and tendon(s) at lower leg level, right leg, initial encounter: Secondary | ICD-10-CM | POA: Diagnosis not present

## 2016-08-15 DIAGNOSIS — S86819A Strain of other muscle(s) and tendon(s) at lower leg level, unspecified leg, initial encounter: Secondary | ICD-10-CM | POA: Insufficient documentation

## 2016-08-15 NOTE — Progress Notes (Signed)
HPI: April [name] is a 45 y.o. female  who presents to [facility] today, 08/15/16,  for chief complaint of:  Chief Complaint  Patient presents with  . Leg Pain     RIGHT CALF     Was going downstairs and her pop in the back of right calf, followed by extreme pain, difficulty walking. Nothing like this has happened before, no previous injury. Patient is worried about some ankle swelling on the side. Even though ankle is painful   Past medical, surgical, social and family history reviewed: No past medical history on file. Past Surgical History:  Procedure Laterality Date  . ABLATION    . CESAREAN SECTION    . CHOLECYSTECTOMY    . TUBAL LIGATION     Social History  Substance Use Topics  . Smoking status: Former Research scientist (life sciences)  . Smokeless tobacco: Not on file  . Alcohol use Yes     Comment: 2-4 q wk   Family History  Problem Relation Age of Onset  . Cancer Maternal Aunt      Current medication list and allergy/intolerance information reviewed:   Current Outpatient Prescriptions  Medication Sig Dispense Refill  . citalopram (CELEXA) 20 MG tablet Take 1.5 tablets (30 mg total) by mouth daily. 45 tablet 11  . Ivermectin (SOOLANTRA) 1 % CREA Apply 1 application topically daily. 45 g 11  . methylphenidate 36 MG PO CR tablet Take 1 tablet (36 mg total) by mouth every morning. 30 tablet 0   No current facility-administered medications for this visit.    No Known Allergies    Review of Systems:  Constitutional:  No  fever, no chills, No recent illness  Musculoskeletal: +new myalgia/arthralgia  Skin: No  Rash, No other wounds/concerning lesions  Neurologic: No  weakness, No  dizziness,    Exam:  BP (!) 144/77   Pulse 84   Ht 5\' 4"  (1.626 m)   Wt 185 lb (83.9 kg)   BMI 31.76 kg/m   Constitutional: VS see above. General Appearance: alert, well-developed, well-nourished, NAD  Neck: No masses, trachea midline.   Respiratory: Normal  respiratory effort.   Cardiovascular: No lower extremity edema. Pedal pulse II/IV on L DP and I/IV on R DP, II/IV bilaterally PT.   Musculoskeletal: Gait antalgic. No clubbing/cyanosis of digits. Slight temperature difference, R leg distal to perhaps a bit cooler but only slightly so. R ankle a bit pale, patient thinks this may have been due to rubbing off spray tan. Achilles tendon is intact bilaterally.  Neurological: No cranial nerve deficit on limited exam. Motor and sensation intact and symmetric. Cerebellar reflexes intact. Normal balance/coordination. No tremor.   Skin: warm, dry, intact. No rash/ulcer.   Psychiatric: Normal judgment/insight. Normal mood and affect. Oriented x3.     ASSESSMENT/PLAN: Given significant pain/swelling, pop heard by patient, though [place] Silos was intact I asked Dr. Darene Lamer for assistance and second opinion regarding management, whether or not patient would need ultrasound/further workup for partial tendon tear. Please see his note for full details.  Strain of calf muscle, right, initial encounter     Visit summary with medication list and pertinent instructions was printed for patient to review. All questions at time of visit were answered - patient instructed to contact office with any additional concerns. ER/RTC precautions were reviewed with the patient. Follow-up plan: Return in about 2 weeks (around 08/29/2016).

## 2016-08-15 NOTE — Progress Notes (Signed)
   Subjective:    I'm seeing this patient as a consultation for:  Dr. Emeterio Reeve  CC: Right calf pain  HPI: This is a pleasant 45 year old female, yesterday she took a misstep and had immediate pain and a pop in her right calf. Pain is now severe, persistent, she has difficulty walking. Only minimal swelling, no bruising.  Past medical history:  Negative.  See flowsheet/record as well for more information.  Surgical history: Negative.  See flowsheet/record as well for more information.  Family history: Negative.  See flowsheet/record as well for more information.  Social history: Negative.  See flowsheet/record as well for more information.  Allergies, and medications have been entered into the medical record, reviewed, and no changes needed.   Review of Systems: No headache, visual changes, nausea, vomiting, diarrhea, constipation, dizziness, abdominal pain, skin rash, fevers, chills, night sweats, weight loss, swollen lymph nodes, body aches, joint swelling, muscle aches, chest pain, shortness of breath, mood changes, visual or auditory hallucinations.   Objective:   General: Well Developed, well nourished, and in no acute distress.  Neuro/Psych: Alert and oriented x3, extra-ocular muscles intact, able to move all 4 extremities, sensation grossly intact. Skin: Warm and dry, no rashes noted.  Respiratory: Not using accessory muscles, speaking in full sentences, trachea midline.  Cardiovascular: Pulses palpable, no extremity edema. Abdomen: Does not appear distended. Right Ankle: No visible erythema or swelling. Range of motion is full in all directions. Strength is 5/5 in all directions. Stable lateral and medial ligaments; squeeze test and kleiger test unremarkable; Talar dome nontender; No pain at base of 5th MT; No tenderness over cuboid; No tenderness over N spot or navicular prominence No tenderness on posterior aspects of lateral and medial malleolus No sign of  peroneal tendon subluxations; Negative tarsal tunnel tinel's Able to walk 4 steps. Tender to palpation at the musculotendinous junction of the medial head of the gastrocnemius, negative Thompson's test.  The ankle and calf were strapped with compressive dressing and a heel lift was placed in her right shoe.  Impression and Recommendations:   This case required medical decision making of moderate complexity.  Strain of calf muscle Right medial gastrocnemius strain. Heel lift, strapped with compressive dressing, may take pain medicine she already has at home. Rehabilitation exercises given, return to see me as needed.

## 2016-08-15 NOTE — Assessment & Plan Note (Signed)
Right medial gastrocnemius strain. Heel lift, strapped with compressive dressing, may take pain medicine she already has at home. Rehabilitation exercises given, return to see me as needed.

## 2016-09-02 ENCOUNTER — Other Ambulatory Visit: Payer: Self-pay

## 2016-09-02 DIAGNOSIS — F909 Attention-deficit hyperactivity disorder, unspecified type: Secondary | ICD-10-CM

## 2016-09-02 MED ORDER — METHYLPHENIDATE HCL ER (OSM) 36 MG PO TBCR: 36.0000 mg | EXTENDED_RELEASE_TABLET | Freq: Every day | ORAL | 0 refills | Status: DC

## 2016-09-02 MED ORDER — METHYLPHENIDATE HCL ER (OSM) 36 MG PO TBCR: 36.0000 mg | EXTENDED_RELEASE_TABLET | ORAL | 0 refills | Status: DC

## 2016-09-03 ENCOUNTER — Other Ambulatory Visit: Payer: Self-pay | Admitting: Sports Medicine

## 2016-09-03 DIAGNOSIS — F411 Generalized anxiety disorder: Secondary | ICD-10-CM

## 2016-11-05 ENCOUNTER — Other Ambulatory Visit: Payer: Self-pay

## 2016-11-05 DIAGNOSIS — F411 Generalized anxiety disorder: Secondary | ICD-10-CM

## 2016-11-05 MED ORDER — CITALOPRAM HYDROBROMIDE 20 MG PO TABS: 20.0000 mg | ORAL_TABLET | Freq: Every day | ORAL | 3 refills | Status: DC

## 2016-12-15 ENCOUNTER — Other Ambulatory Visit: Payer: Self-pay

## 2016-12-15 DIAGNOSIS — F909 Attention-deficit hyperactivity disorder, unspecified type: Secondary | ICD-10-CM

## 2016-12-15 MED ORDER — METHYLPHENIDATE HCL ER (OSM) 36 MG PO TBCR: 36.0000 mg | EXTENDED_RELEASE_TABLET | ORAL | 0 refills | Status: DC

## 2016-12-15 NOTE — Progress Notes (Signed)
Appt required for refills

## 2017-01-01 ENCOUNTER — Ambulatory Visit (INDEPENDENT_AMBULATORY_CARE_PROVIDER_SITE_OTHER): Payer: Managed Care, Other (non HMO) | Admitting: Sports Medicine

## 2017-01-01 ENCOUNTER — Encounter: Payer: Self-pay | Admitting: Sports Medicine

## 2017-01-01 DIAGNOSIS — F909 Attention-deficit hyperactivity disorder, unspecified type: Secondary | ICD-10-CM | POA: Diagnosis not present

## 2017-01-01 DIAGNOSIS — Z0000 Encounter for general adult medical examination without abnormal findings: Secondary | ICD-10-CM | POA: Diagnosis not present

## 2017-01-01 MED ORDER — METHYLPHENIDATE HCL ER (OSM) 36 MG PO TBCR: 36.0000 mg | EXTENDED_RELEASE_TABLET | ORAL | 0 refills | Status: DC

## 2017-01-01 NOTE — Progress Notes (Signed)
  Subjective:    CC: Follow-up  HPI: Attention deficit disorder: Stable on Concerta 36.  Preventive measures: Tells me she never wants any immunizations, she is due for some blood work.  Past medical history:  Negative.  See flowsheet/record as well for more information.  Surgical history: Negative.  See flowsheet/record as well for more information.  Family history: Negative.  See flowsheet/record as well for more information.  Social history: Negative.  See flowsheet/record as well for more information.  Allergies, and medications have been entered into the medical record, reviewed, and no changes needed.   Review of Systems: No fevers, chills, night sweats, weight loss, chest pain, or shortness of breath.   Objective:    General: Well Developed, well nourished, and in no acute distress.  Neuro: Alert and oriented x3, extra-ocular muscles intact, sensation grossly intact.  HEENT: Normocephalic, atraumatic, pupils equal round reactive to light, neck supple, no masses, no lymphadenopathy, thyroid nonpalpable.  Skin: Warm and dry, no rashes. Cardiac: Regular rate and rhythm, no murmurs rubs or gallops, no lower extremity edema.  Respiratory: Clear to auscultation bilaterally. Not using accessory muscles, speaking in full sentences.  Impression and Recommendations:    Adult ADHD Stable on current dose, refilling.  Annual physical exam Ordering routine blood work. Patient declines all vaccinations.

## 2017-01-01 NOTE — Assessment & Plan Note (Signed)
Ordering routine blood work. Patient declines all vaccinations.

## 2017-01-01 NOTE — Assessment & Plan Note (Signed)
Stable on current dose, refilling.

## 2017-01-19 ENCOUNTER — Ambulatory Visit (INDEPENDENT_AMBULATORY_CARE_PROVIDER_SITE_OTHER): Payer: Managed Care, Other (non HMO) | Admitting: Family Medicine

## 2017-01-19 VITALS — BP 141/68 | HR 90 | Temp 97.7°F | Wt 190.0 lb

## 2017-01-19 DIAGNOSIS — Z23 Encounter for immunization: Secondary | ICD-10-CM

## 2017-01-19 DIAGNOSIS — J018 Other acute sinusitis: Secondary | ICD-10-CM | POA: Diagnosis not present

## 2017-01-19 MED ORDER — AMOXICILLIN-POT CLAVULANATE 875-125 MG PO TABS: 1.0000 | ORAL_TABLET | Freq: Two times a day (BID) | ORAL | 0 refills | Status: DC

## 2017-01-19 NOTE — Patient Instructions (Addendum)
Sinus Rinse What is a sinus rinse? A sinus rinse is a simple home treatment that is used to rinse your sinuses with a sterile mixture of salt and water (saline solution). Sinuses are air-filled spaces in your skull behind the bones of your face and forehead that open into your nasal cavity. You will use the following:  Saline solution.  Neti pot or spray bottle. This releases the saline solution into your nose and through your sinuses. Neti pots and spray bottles can be purchased at your local pharmacy, a health food store, or online.  When would I do a sinus rinse? A sinus rinse can help to clear mucus, dirt, dust, or pollen from the nasal cavity. You may do a sinus rinse when you have a cold, a virus, nasal allergy symptoms, a sinus infection, or stuffiness in the nose or sinuses. If you are considering a sinus rinse:  Ask your child's health care provider before performing a sinus rinse on your child.  Do not do a sinus rinse if you have had ear or nasal surgery, ear infection, or blocked ears.  How do I do a sinus rinse?  Wash your hands.  Disinfect your device according to the directions provided and then dry it.  Use the solution that comes with your device or one that is sold separately in stores. Follow the mixing directions on the package.  Fill your device with the amount of saline solution as directed by the device instructions.  Stand over a sink and tilt your head sideways over the sink.  Place the spout of the device in your upper nostril (the one closer to the ceiling).  Gently pour or squeeze the saline solution into the nasal cavity. The liquid should drain to the lower nostril if you are not overly congested.  Gently blow your nose. Blowing too hard may cause ear pain.  Repeat in the other nostril.  Clean and rinse your device with clean water and then air-dry it. Are there risks of a sinus rinse? Sinus rinse is generally very safe and effective. However,  there are a few risks, which include:  A burning sensation in the sinuses. This may happen if you do not make the saline solution as directed. Make sure to follow all directions when making the saline solution.  Infection from contaminated water. This is rare, but possible.  Nasal irritation.  This information is not intended to replace advice given to you by your health care provider. Make sure you discuss any questions you have with your health care provider. Document Released: 05/31/2014 Document Revised: 09/30/2016 Document Reviewed: 03/21/2014 Elsevier Interactive Patient Education  2017 [employer]  

## 2017-01-19 NOTE — Progress Notes (Signed)
   Subjective:    Patient ID: April [name], female    DOB: 31-Jan-1971, 46 y.o.   MRN: [id number]  HPI 46 year old female comes in today with one week of respiratory symptoms. She said it started with a sore throat approximately 8 days ago and then about 6 days ago started to get some nasal congestion while she was traveling in [place]. She's also had a raspy voice. Slight intermittent cough but usually just from postnasal drip. No shortness of breath. No sore throat. She has had some pressure in both ears. No fevers chills or sweats. No facial pain.   Review of Systems     Objective:   Physical Exam  Constitutional: She is oriented to person, place, and time. She appears well-developed and well-nourished.  HENT:  Head: Normocephalic and atraumatic.  Right Ear: External ear normal.  Left Ear: External ear normal.  Nose: Nose normal.  Mouth/Throat: Oropharynx is clear and moist.  TMs and canals are clear.   Eyes: Conjunctivae and EOM are normal. Pupils are equal, round, and reactive to light.  Neck: Neck supple. No thyromegaly present.  Cardiovascular: Normal rate, regular rhythm and normal heart sounds.   Pulmonary/Chest: Effort normal and breath sounds normal. She has no wheezes.  Lymphadenopathy:    She has no cervical adenopathy.  Neurological: She is alert and oriented to person, place, and time.  Skin: Skin is warm and dry.  Psychiatric: She has a normal mood and affect.          Assessment & Plan:  Acute sinusitis 8 days-I suspect most likely viral since she's really not having any facial pain or fever. Will  go ahead and send a prescription for an antibiotic to fill in the next couple days if she is not feeling better or sooner if suddenly worse and she is leaving for [place] at the end of the week.

## 2017-02-09 IMAGING — CR DG CHEST 2V
2 series · 2 of 2 positions shown · non-contrast
Comparison: 01/08/2015

CLINICAL DATA: Cough and congestion for 6 weeks, former smoker

EXAM:
CHEST  2 VIEW

[chest pa]
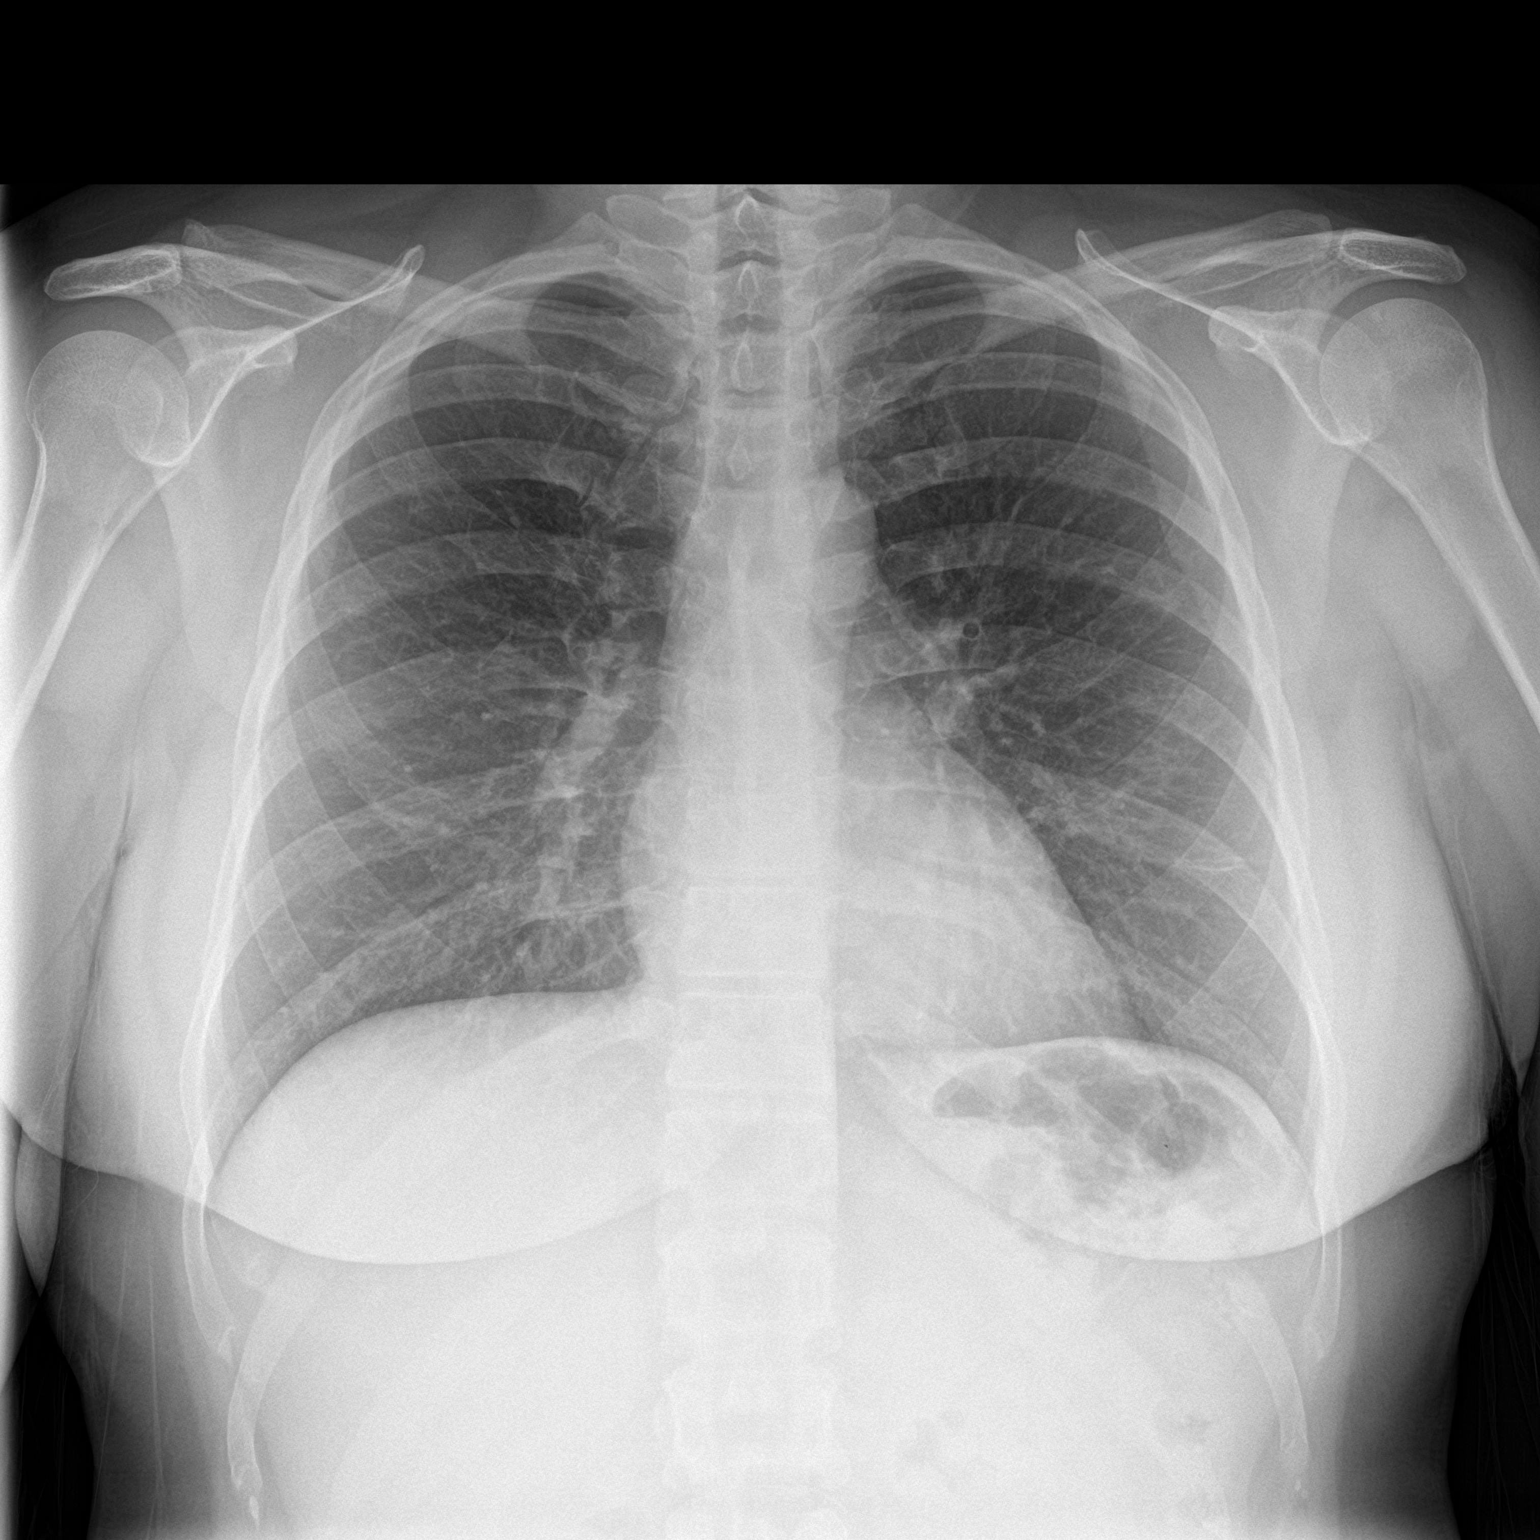

[chest lat]
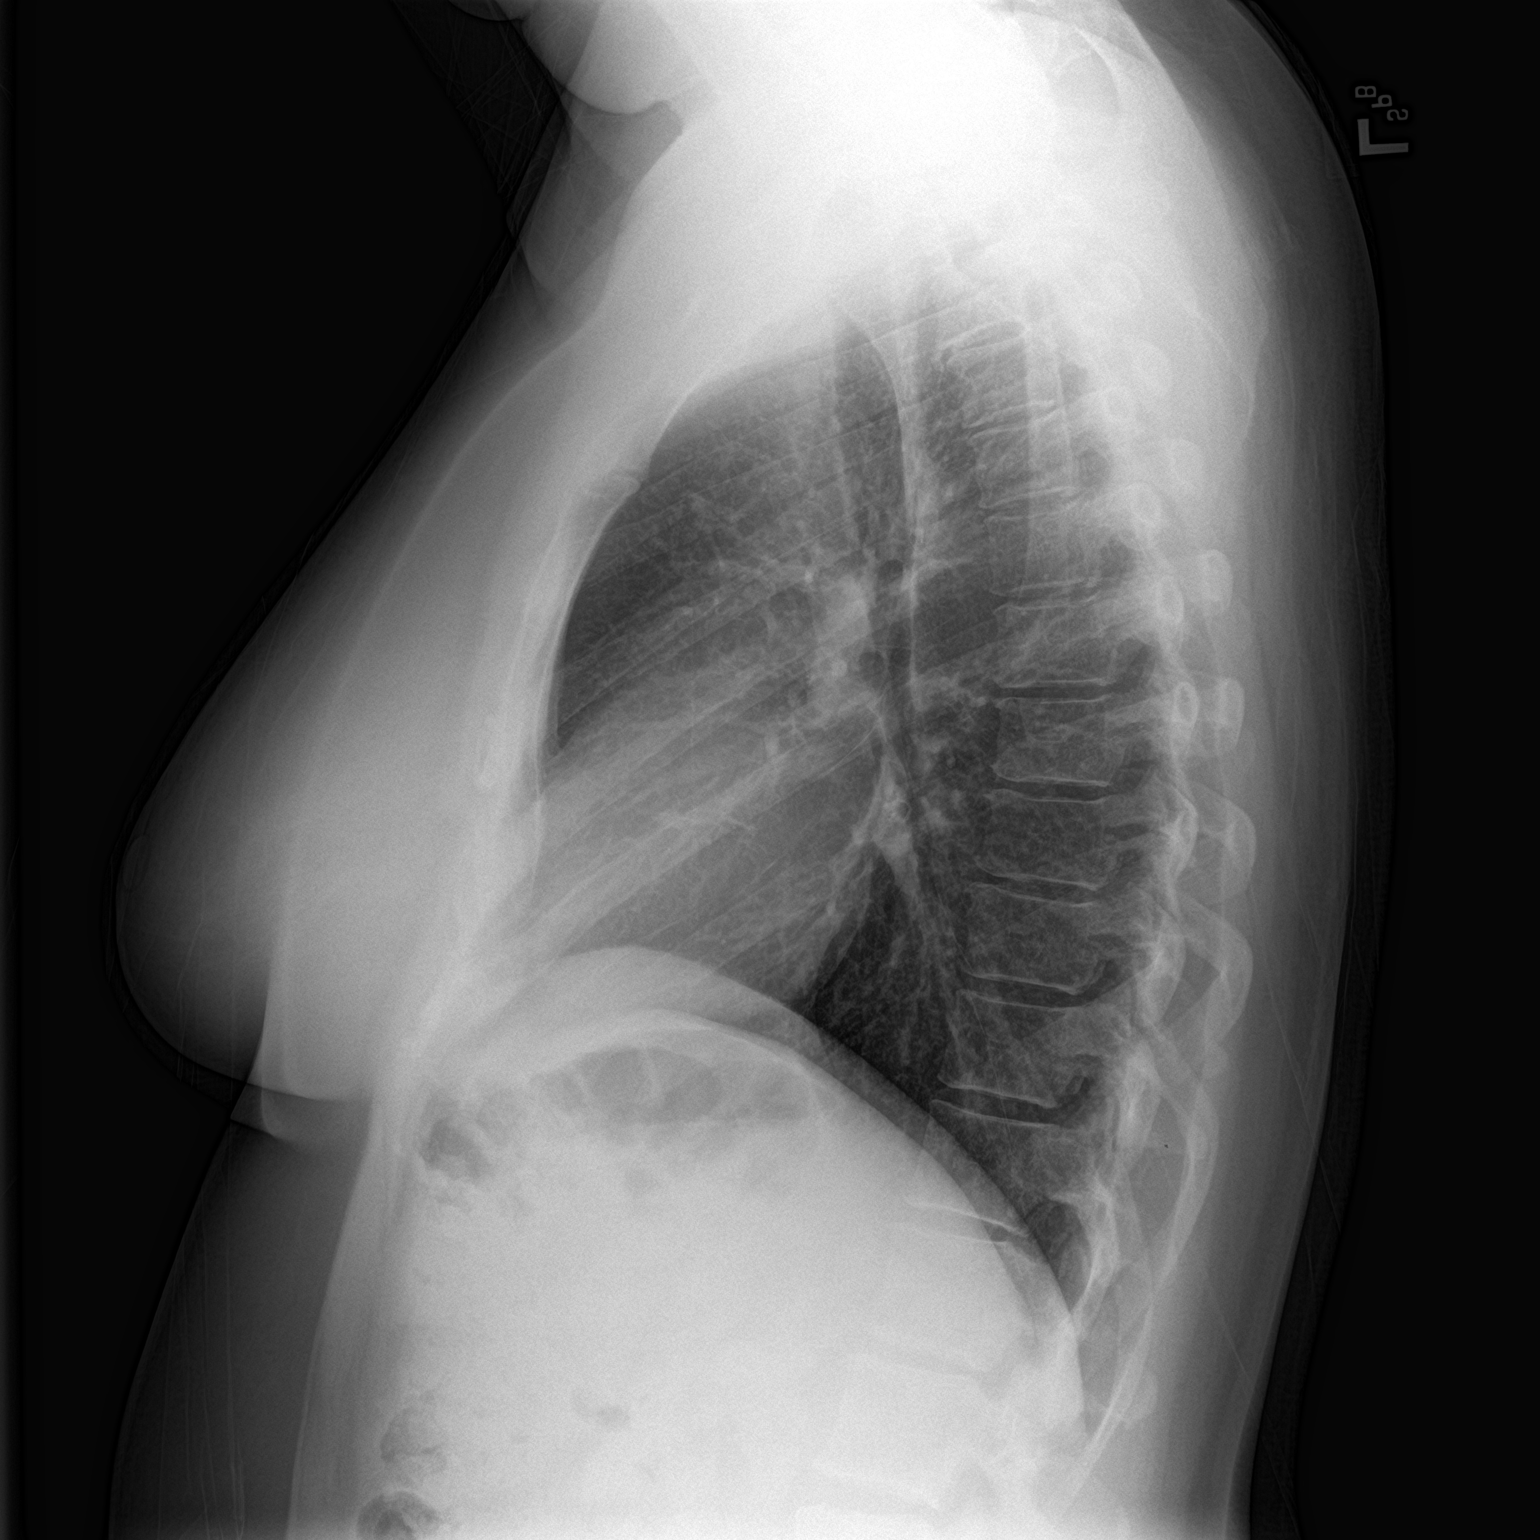

[2 of 2 positions shown; findings below may reference images not displayed]

FINDINGS: Normal heart size, mediastinal contours, and pulmonary vascularity.

Mild peribronchial thickening.

Minimal linear subsegmental atelectasis at lingula.

No pulmonary infiltrate, pleural effusion, or pneumothorax.

Bones unremarkable.
IMPRESSION: Bronchitic changes with minimal subsegmental atelectasis at lingula.

## 2017-04-07 ENCOUNTER — Other Ambulatory Visit: Payer: Self-pay

## 2017-04-07 DIAGNOSIS — F909 Attention-deficit hyperactivity disorder, unspecified type: Secondary | ICD-10-CM

## 2017-04-07 MED ORDER — METHYLPHENIDATE HCL ER (OSM) 36 MG PO TBCR: 36.0000 mg | EXTENDED_RELEASE_TABLET | ORAL | 0 refills | Status: DC

## 2017-04-15 DIAGNOSIS — M66241 Spontaneous rupture of extensor tendons, right hand: Secondary | ICD-10-CM | POA: Insufficient documentation

## 2017-07-08 ENCOUNTER — Telehealth: Payer: Self-pay | Admitting: Sports Medicine

## 2017-07-08 ENCOUNTER — Other Ambulatory Visit: Payer: Self-pay

## 2017-07-08 DIAGNOSIS — F909 Attention-deficit hyperactivity disorder, unspecified type: Secondary | ICD-10-CM

## 2017-07-08 DIAGNOSIS — Z0000 Encounter for general adult medical examination without abnormal findings: Secondary | ICD-10-CM

## 2017-07-08 MED ORDER — METHYLPHENIDATE HCL ER (OSM) 36 MG PO TBCR: 36.0000 mg | EXTENDED_RELEASE_TABLET | ORAL | 0 refills | Status: DC

## 2017-07-08 NOTE — Telephone Encounter (Signed)
Orders placed.

## 2017-07-08 NOTE — Telephone Encounter (Signed)
Pt called. She has scheduled an appointment for s Physical on August 28th and would like to pick up lab orders before her appt.  Thank you.

## 2017-07-09 NOTE — Telephone Encounter (Signed)
Left VM stating labs ordered.

## 2017-07-13 ENCOUNTER — Ambulatory Visit: Payer: Self-pay | Admitting: Sports Medicine

## 2017-07-13 LAB — CBC
HCT: 41.9 % (ref 35.0–45.0)
Hemoglobin: 13.9 g/dL (ref 11.7–15.5)
MCH: 31.4 pg (ref 27.0–33.0)
MCHC: 33.2 g/dL (ref 32.0–36.0)
MCV: 94.8 fL (ref 80.0–100.0)
MPV: 11.8 fL (ref 7.5–12.5)
Platelets: 270 K/uL (ref 140–400)
RBC: 4.42 MIL/uL (ref 3.80–5.10)
RDW: 13.8 % (ref 11.0–15.0)
WBC: 9.7 K/uL (ref 3.8–10.8)

## 2017-07-14 ENCOUNTER — Encounter: Payer: Self-pay | Admitting: Sports Medicine

## 2017-07-14 ENCOUNTER — Ambulatory Visit (INDEPENDENT_AMBULATORY_CARE_PROVIDER_SITE_OTHER): Payer: Managed Care, Other (non HMO) | Admitting: Sports Medicine

## 2017-07-14 DIAGNOSIS — E785 Hyperlipidemia, unspecified: Secondary | ICD-10-CM | POA: Diagnosis not present

## 2017-07-14 DIAGNOSIS — Z0000 Encounter for general adult medical examination without abnormal findings: Secondary | ICD-10-CM | POA: Diagnosis not present

## 2017-07-14 LAB — COMPREHENSIVE METABOLIC PANEL
ALT: 10 U/L (ref 6–29)
Alkaline Phosphatase: 57 U/L (ref 33–115)
CO2: 22 mmol/L (ref 20–32)
Calcium: 8.8 mg/dL (ref 8.6–10.2)
Glucose, Bld: 89 mg/dL (ref 65–99)
Potassium: 4.3 mmol/L (ref 3.5–5.3)

## 2017-07-14 LAB — LIPID PANEL W/REFLEX DIRECT LDL
Cholesterol: 215 mg/dL — ABNORMAL HIGH (ref <200)
HDL: 61 mg/dL (ref >50)
LDL-Cholesterol: 121 mg/dL — ABNORMAL HIGH
Non-HDL Cholesterol (Calc): 154 mg/dL — ABNORMAL HIGH (ref <130)
Total CHOL/HDL Ratio: 3.5 Ratio (ref <5.0)
Triglycerides: 212 mg/dL — ABNORMAL HIGH (ref <150)

## 2017-07-14 LAB — HEMOGLOBIN A1C
Hgb A1c MFr Bld: 5.3 % (ref <5.7)
Mean Plasma Glucose: 105 mg/dL

## 2017-07-14 LAB — COMPREHENSIVE METABOLIC PANEL WITH GFR
AST: 12 U/L (ref 10–35)
Albumin: 4 g/dL (ref 3.6–5.1)
BUN: 12 mg/dL (ref 7–25)
Chloride: 105 mmol/L (ref 98–110)
Creat: 0.67 mg/dL (ref 0.50–1.10)
Sodium: 139 mmol/L (ref 135–146)
Total Bilirubin: 0.3 mg/dL (ref 0.2–1.2)
Total Protein: 6.2 g/dL (ref 6.1–8.1)

## 2017-07-14 LAB — HIV ANTIBODY (ROUTINE TESTING W REFLEX): HIV 1&2 Ab, 4th Generation: NONREACTIVE

## 2017-07-14 LAB — VITAMIN D 25 HYDROXY (VIT D DEFICIENCY, FRACTURES): Vit D, 25-Hydroxy: 31 ng/mL (ref 30–100)

## 2017-07-14 LAB — TSH: TSH: 2.97 m[IU]/L

## 2017-07-14 NOTE — Patient Instructions (Signed)
Fat and Cholesterol Restricted Diet High levels of fat and cholesterol in your blood may lead to various health problems, such as diseases of the heart, blood vessels, gallbladder, liver, and pancreas. Fats are concentrated sources of energy that come in various forms. Certain types of fat, including saturated fat, may be harmful in excess. Cholesterol is a substance needed by your body in small amounts. Your body makes all the cholesterol it needs. Excess cholesterol comes from the food you eat. When you have high levels of cholesterol and saturated fat in your blood, health problems can develop because the excess fat and cholesterol will gather along the walls of your blood vessels, causing them to narrow. Choosing the right foods will help you control your intake of fat and cholesterol. This will help keep the levels of these substances in your blood within normal limits and reduce your risk of disease.  What types of fat should I choose?  Choose healthy fats more often. Choose monounsaturated and polyunsaturated fats, such as olive and canola oil, flaxseeds, walnuts, almonds, and seeds.  Eat more omega-3 fats. Good choices include salmon, mackerel, sardines, tuna, flaxseed oil, and ground flaxseeds. Aim to eat fish at least two times a week.  Limit saturated fats. Saturated fats are primarily found in animal products, such as meats, butter, and cream. Plant sources of saturated fats include palm oil, palm kernel oil, and coconut oil.  Avoid foods with partially hydrogenated oils in them. These contain trans fats. Examples of foods that contain trans fats are stick margarine, some tub margarines, cookies, crackers, and other baked goods. What general guidelines do I need to follow? These guidelines for healthy eating will help you control your intake of fat and cholesterol:  Check food labels carefully to identify foods with trans fats or high amounts of saturated fat.  Fill one half of your  plate with vegetables and green salads.  Fill one fourth of your plate with whole grains. Look for the word "whole" as the first word in the ingredient list.  Fill one fourth of your plate with lean protein foods.  Limit fruit to two servings a day. Choose fruit instead of juice.  Eat more foods that contain fiber, such as apples, broccoli, carrots, beans, peas, and barley.  Eat more home-cooked food and less restaurant, buffet, and fast food.  Limit or avoid alcohol.  Limit foods high in starch and sugar.  Limit fried foods.  Cook foods using methods other than frying. Baking, boiling, grilling, and broiling are all great options.  Lose weight if you are overweight. Losing just 5-10% of your initial body weight can help your overall health and prevent diseases such as diabetes and heart disease.  What foods can I eat? Grains  Whole grains, such as whole wheat or whole grain breads, crackers, cereals, and pasta. Unsweetened oatmeal, bulgur, barley, quinoa, or brown rice. Corn or whole wheat flour tortillas. Vegetables  Fresh or frozen vegetables (raw, steamed, roasted, or grilled). Green salads. Fruits  All fresh, canned (in natural juice), or frozen fruits. Meats and other protein foods  Ground beef (85% or leaner), grass-fed beef, or beef trimmed of fat. Skinless chicken or [place]. Ground chicken or [place]. Pork trimmed of fat. All fish and seafood. Eggs. Dried beans, peas, or lentils. Unsalted nuts or seeds. Unsalted canned or dry beans. Dairy  Low-fat dairy products, such as skim or 1% milk, 2% or reduced-fat cheeses, low-fat ricotta or cottage cheese, or plain low-fat yo Fats and  oils  Tub margarines without trans fats. Light or reduced-fat mayonnaise and salad dressings. Avocado. Olive, canola, sesame, or safflower oils. Natural peanut or almond butter (choose ones without added sugar and oil). The items listed above may not be a complete list of recommended foods or  beverages. Contact your dietitian for more options. Foods to avoid Grains  White bread. White pasta. White rice. Cornbread. Bagels, pastries, and croissants. Crackers that contain trans fat. Vegetables  White potatoes. Corn. Creamed or fried vegetables. Vegetables in a cheese sauce. Fruits  Dried fruits. Canned fruit in light or heavy syrup. Fruit juice. Meats and other protein foods  Fatty cuts of meat. Ribs, chicken wings, bacon, sausage, bologna, salami, chitterlings, fatback, hot dogs, bratwurst, and packaged luncheon meats. Liver and organ meats. Dairy  Whole or 2% milk, cream, half-and-half, and cream cheese. Whole milk cheeses. Whole-fat or sweetened yogurt. Full-fat cheeses. Nondairy creamers and whipped toppings. Processed cheese, cheese spreads, or cheese curds. Beverages  Alcohol. Sweetened drinks (such as sodas, lemonade, and fruit drinks or punches). Fats and oils  Butter, stick margarine, lard, shortening, ghee, or bacon fat. Coconut, palm kernel, or palm oils. Sweets and desserts  Corn syrup, sugars, honey, and molasses. Candy. Jam and jelly. Syrup. Sweetened cereals. Cookies, pies, cakes, donuts, muffins, and ice cream. The items listed above may not be a complete list of foods and beverages to avoid. Contact your dietitian for more information. This information is not intended to replace advice given to you by your health care provider. Make sure you discuss any questions you have with your health care provider. Document Released: 11/03/2005 Document Revised: 11/24/2014 Document Reviewed: 02/01/2014 Elsevier Interactive Patient Education  2017 [employer].

## 2017-07-14 NOTE — Progress Notes (Signed)
  Subjective:    CC: Annual physical   HPI:  This is a pleasant 46 year old female here for physical, she really has no complaints.  Past medical history:  Negative.  See flowsheet/record as well for more information.  Surgical history: Negative.  See flowsheet/record as well for more information.  Family history: Negative.  See flowsheet/record as well for more information.  Social history: Negative.  See flowsheet/record as well for more information.  Allergies, and medications have been entered into the medical record, reviewed, and no changes needed.    Review of Systems: No headache, visual changes, nausea, vomiting, diarrhea, constipation, dizziness, abdominal pain, skin rash, fevers, chills, night sweats, swollen lymph nodes, weight loss, chest pain, body aches, joint swelling, muscle aches, shortness of breath, mood changes, visual or auditory hallucinations.  Objective:    General: Well Developed, well nourished, and in no acute distress.  Neuro: Alert and oriented x3, extra-ocular muscles intact, sensation grossly intact. Cranial nerves II through XII are intact, motor, sensory, and coordinative functions are all intact. HEENT: Normocephalic, atraumatic, pupils equal round reactive to light, neck supple, no masses, no lymphadenopathy, thyroid nonpalpable. Oropharynx, nasopharynx, external ear canals are unremarkable. Skin: Warm and dry, no rashes noted.  Cardiac: Regular rate and rhythm, no murmurs rubs or gallops.  Respiratory: Clear to auscultation bilaterally. Not using accessory muscles, speaking in full sentences.  Abdominal: Soft, nontender, nondistended, positive bowel sounds, no masses, no organomegaly.  Musculoskeletal: Shoulder, elbow, wrist, hip, knee, ankle stable, and with full range of motion.  Impression and Recommendations:    The patient was counselled, risk factors were discussed, anticipatory guidance given.  Annual physical exam Physical as above. Went  over labs.  Hyperlipidemia LDL goal <100 Low cholesterol diet, recheck in 3 months.

## 2017-07-14 NOTE — Assessment & Plan Note (Signed)
Physical as above. Went over labs.

## 2017-07-14 NOTE — Assessment & Plan Note (Signed)
Low cholesterol diet, recheck in 3 months.

## 2017-08-03 DIAGNOSIS — M5020 Other cervical disc displacement, unspecified cervical region: Secondary | ICD-10-CM | POA: Insufficient documentation

## 2017-08-05 DIAGNOSIS — R7989 Other specified abnormal findings of blood chemistry: Secondary | ICD-10-CM | POA: Insufficient documentation

## 2017-08-11 ENCOUNTER — Other Ambulatory Visit: Payer: Self-pay

## 2017-08-11 NOTE — Patient Outreach (Signed)
[facility]) Care Management  08/11/2017  April [name] [birth year] [id number]   Transition of care call completed.  Member had an anterior cervical discectomy and fusion multi level C 4-6 on 9/17 and discharged on 9/18 from [facility].  Subjective:  Member states that she has been doing good since she came home last week.  States she is wearing her hard collar and doing the neck stretches she was taught in the hospital.  States she is having less pain and is only needing to take her pain medication twice a day and she is no longer taking the Valium.  States her bowels are moving good.  States her surgery site is healing well.  States she is to go back to see the surgeon on 09/02/17 and she was not told she needed to follow up with her primary care provider.  States she was instructed she can drive in 2 weeks.  States her husband can drive her until she is allowed to drive.    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  States her family is supportive and is helping her while she recovers  Assessment:   S/p anterior cervical discectomy and fusion mulit levels C 4-6 Transition of care call completed.  Medications reviewed.  Member has follow up with neurosurgeon on 09/02/17.  Reports surgical site healing well and she is following activity restrictions.   Member has a good support system.  Plan: Instructed to call Dr.Thekkekandam to schedule follow up appt in 1-2 weeks. Reviewed discharge instructions Member assessed with no further interventions needed Plan to send successful outreach letter  Peter Garter RN, [facility] Care Management Coordinator-Link to [place] Management [phone]

## 2017-09-02 DIAGNOSIS — Z09 Encounter for follow-up examination after completed treatment for conditions other than malignant neoplasm: Secondary | ICD-10-CM | POA: Insufficient documentation

## 2017-09-17 ENCOUNTER — Encounter: Payer: Self-pay | Admitting: Sports Medicine

## 2017-09-17 ENCOUNTER — Ambulatory Visit (INDEPENDENT_AMBULATORY_CARE_PROVIDER_SITE_OTHER): Payer: Managed Care, Other (non HMO)

## 2017-09-17 ENCOUNTER — Ambulatory Visit (INDEPENDENT_AMBULATORY_CARE_PROVIDER_SITE_OTHER): Payer: Managed Care, Other (non HMO) | Admitting: Sports Medicine

## 2017-09-17 DIAGNOSIS — R197 Diarrhea, unspecified: Secondary | ICD-10-CM

## 2017-09-17 LAB — COMPREHENSIVE METABOLIC PANEL
AG Ratio: 1.8 (calc) (ref 1.0–2.5)
AST: 12 U/L (ref 10–35)
BUN: 9 mg/dL (ref 7–25)
Calcium: 9.8 mg/dL (ref 8.6–10.2)
Chloride: 105 mmol/L (ref 98–110)
Glucose, Bld: 85 mg/dL (ref 65–139)
Potassium: 4.2 mmol/L (ref 3.5–5.3)
Total Bilirubin: 0.2 mg/dL (ref 0.2–1.2)

## 2017-09-17 LAB — CBC WITH DIFFERENTIAL/PLATELET
Basophils Absolute: 43 cells/uL (ref 0–200)
Basophils Relative: 0.5 %
Eosinophils Absolute: 68 cells/uL (ref 15–500)
Eosinophils Relative: 0.8 %
HCT: 40.1 % (ref 35.0–45.0)
Hemoglobin: 13.7 g/dL (ref 11.7–15.5)
Lymphs Abs: 2270 {cells}/uL (ref 850–3900)
MCH: 31.4 pg (ref 27.0–33.0)
MCHC: 34.2 g/dL (ref 32.0–36.0)
MCV: 91.8 fL (ref 80.0–100.0)
MPV: 11.9 fL (ref 7.5–12.5)
Monocytes Relative: 6.7 %
Neutro Abs: 5551 cells/uL (ref 1500–7800)
Neutrophils Relative %: 65.3 %
Platelets: 241 Thousand/uL (ref 140–400)
RBC: 4.37 Million/uL (ref 3.80–5.10)
RDW: 12.4 % (ref 11.0–15.0)
Total Lymphocyte: 26.7 %
WBC mixed population: 570 {cells}/uL (ref 200–950)
WBC: 8.5 Thousand/uL (ref 3.8–10.8)

## 2017-09-17 LAB — COMPREHENSIVE METABOLIC PANEL WITH GFR
ALT: 13 U/L (ref 6–29)
Albumin: 4.4 g/dL (ref 3.6–5.1)
Alkaline phosphatase (APISO): 73 U/L (ref 33–115)
CO2: 26 mmol/L (ref 20–32)
Creat: 0.77 mg/dL (ref 0.50–1.10)
Globulin: 2.4 g/dL (ref 1.9–3.7)
Sodium: 139 mmol/L (ref 135–146)
Total Protein: 6.8 g/dL (ref 6.1–8.1)

## 2017-09-17 LAB — TSH: TSH: 1.52 m[IU]/L

## 2017-09-17 LAB — AMYLASE: Amylase: 42 U/L (ref 21–101)

## 2017-09-17 LAB — LIPASE: Lipase: 16 U/L (ref 7–60)

## 2017-09-17 NOTE — Progress Notes (Signed)
  Subjective:    CC: Follow-up  HPI: This is a pleasant 46 year old female, she had a cervical ACDF about 5 weeks ago, was given the usual single dose of Ancef perioperatively, at the end of her hospitalization as well as at home she has had an increasing amount of watery diarrhea, 5 times per day, no blood, slight malodorousness, no melena.  Mild nausea without vomiting, she does have some of the expected difficulty swallowing.  Overall feels tired, fatigued, and sick.  Past medical history:  Negative.  See flowsheet/record as well for more information.  Surgical history: Negative.  See flowsheet/record as well for more information.  Family history: Negative.  See flowsheet/record as well for more information.  Social history: Negative.  See flowsheet/record as well for more information.  Allergies, and medications have been entered into the medical record, reviewed, and no changes needed.   Review of Systems: No fevers, chills, night sweats, weight loss, chest pain, or shortness of breath.   Objective:    General: Well Developed, well nourished, and in no acute distress.  Neuro: Alert and oriented x3, extra-ocular muscles intact, sensation grossly intact.  HEENT: Normocephalic, atraumatic, pupils equal round reactive to light, neck supple, no masses, no lymphadenopathy, thyroid nonpalpable.  Well-healed ACDF scar. Skin: Warm and dry, no rashes. Cardiac: Regular rate and rhythm, no murmurs rubs or gallops, no lower extremity edema.  Respiratory: Clear to auscultation bilaterally. Not using accessory muscles, speaking in full sentences. Abdomen: Soft, nontender, nondistended, no bowel sounds, no palpable masses, no guarding, rigidity, rebound tenderness.  Impression and Recommendations:    Diarrhea in adult patient Occurred since hospitalization for ACDF, she had a single dose of Ancef in the OR, no further antibiotics. 5 times per day, liquid, no hematochezia or melena. Mild nausea,  did have the expected dysphagia after an ACDF. Testing for C. difficile, stool cultures, CBC, CMP, lipase, amylase. ___________________________________________ Gwen Her. Dianah Field, M.D., ABFM., CAQSM. Primary Care and [place] Instructor of [place] of [facility] of Medicine

## 2017-09-17 NOTE — Assessment & Plan Note (Signed)
Occurred since hospitalization for ACDF, she had a single dose of Ancef in the OR, no further antibiotics. 5 times per day, liquid, no hematochezia or melena. Mild nausea, did have the expected dysphagia after an ACDF. Testing for C. difficile, stool cultures, CBC, CMP, lipase, amylase.

## 2017-09-18 ENCOUNTER — Encounter: Payer: Self-pay | Admitting: Sports Medicine

## 2017-09-18 LAB — C. DIFFICILE GDH AND TOXIN A/B
GDH ANTIGEN: NOT DETECTED
MICRO NUMBER:: 81228627
SPECIMEN QUALITY:: ADEQUATE
TOXIN A AND B: NOT DETECTED

## 2017-09-18 MED ORDER — HYOSCYAMINE SULFATE 0.125 MG PO TABS: ORAL_TABLET | ORAL | 3 refills | Status: DC

## 2017-09-21 LAB — STOOL CULTURE
MICRO NUMBER:: 81228588
MICRO NUMBER:: 81228590
MICRO NUMBER:: 81228591
SHIGA RESULT:: NOT DETECTED
SPECIMEN QUALITY:: ADEQUATE
SPECIMEN QUALITY:: ADEQUATE
SPECIMEN QUALITY:: ADEQUATE

## 2017-09-23 ENCOUNTER — Encounter: Payer: Self-pay | Admitting: Sports Medicine

## 2017-09-24 DIAGNOSIS — R1319 Other dysphagia: Secondary | ICD-10-CM | POA: Insufficient documentation

## 2017-09-24 DIAGNOSIS — Z981 Arthrodesis status: Secondary | ICD-10-CM | POA: Insufficient documentation

## 2017-10-14 ENCOUNTER — Ambulatory Visit: Payer: Self-pay | Admitting: Sports Medicine

## 2017-10-14 DIAGNOSIS — Z0189 Encounter for other specified special examinations: Secondary | ICD-10-CM

## 2017-11-20 ENCOUNTER — Other Ambulatory Visit: Payer: Self-pay | Admitting: Sports Medicine

## 2017-11-20 DIAGNOSIS — F411 Generalized anxiety disorder: Secondary | ICD-10-CM

## 2017-12-01 ENCOUNTER — Ambulatory Visit (INDEPENDENT_AMBULATORY_CARE_PROVIDER_SITE_OTHER): Payer: Managed Care, Other (non HMO) | Admitting: Sports Medicine

## 2017-12-01 ENCOUNTER — Encounter: Payer: Self-pay | Admitting: Sports Medicine

## 2017-12-01 DIAGNOSIS — Z0000 Encounter for general adult medical examination without abnormal findings: Secondary | ICD-10-CM | POA: Diagnosis not present

## 2017-12-01 DIAGNOSIS — F411 Generalized anxiety disorder: Secondary | ICD-10-CM | POA: Diagnosis not present

## 2017-12-01 DIAGNOSIS — F909 Attention-deficit hyperactivity disorder, unspecified type: Secondary | ICD-10-CM

## 2017-12-01 MED ORDER — METHYLPHENIDATE HCL ER (OSM) 27 MG PO TBCR: 27.0000 mg | EXTENDED_RELEASE_TABLET | ORAL | 0 refills | Status: DC

## 2017-12-01 NOTE — Assessment & Plan Note (Signed)
Up-to-date on cervical cancer screening, breast cancer screening.

## 2017-12-01 NOTE — Progress Notes (Signed)
  Subjective:    CC: Elevated blood pressure  HPI: This is a pleasant 47 year old female, she has had an elevated blood pressure on a few readings with her neurosurgeon.  She is taking ADHD medicine and drinks large amounts of coffee per day.  Mild headache but this is from a recent deep tissue massage, no visual changes, chest pain.  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  Preventive measures: Up-to-date on cervical cancer screening.  I reviewed the past medical history, family history, social history, surgical history, and allergies today and no changes were needed.  Please see the problem list section below in epic for further details.  Past Medical History: No past medical history on file. Past Surgical History: Past Surgical History:  Procedure Laterality Date  . ABLATION    . CESAREAN SECTION    . CHOLECYSTECTOMY    . TUBAL LIGATION     Social History: Social History   Socioeconomic History  . Marital status: Married    Spouse name: None  . Number of children: None  . Years of education: None  . Highest education level: None  Social Needs  . Financial resource strain: None  . Food insecurity - worry: None  . Food insecurity - inability: None  . Transportation needs - medical: None  . Transportation needs - non-medical: None  Occupational History  . None  Tobacco Use  . Smoking status: Former Research scientist (life sciences)  . Smokeless tobacco: Never Used  Substance and Sexual Activity  . Alcohol use: Yes    Comment: 2-4 q wk  . Drug use: No  . Sexual activity: Yes  Other Topics Concern  . None  Social History Narrative  . None   Family History: Family History  Problem Relation Age of Onset  . Cancer Maternal Aunt    Allergies: No Known Allergies Medications: See med rec.  Review of Systems: No fevers,  chills, night sweats, weight loss, chest pain, or shortness of breath.   Objective:    General: Well Developed, well nourished, and in no acute distress.  Neuro: Alert and oriented x3, extra-ocular muscles intact, sensation grossly intact.  HEENT: Normocephalic, atraumatic, pupils equal round reactive to light, neck supple, no masses, no lymphadenopathy, thyroid nonpalpable.  Skin: Warm and dry, no rashes. Cardiac: Regular rate and rhythm, no murmurs rubs or gallops, no lower extremity edema.  Respiratory: Clear to auscultation bilaterally. Not using accessory muscles, speaking in full sentences.  Impression and Recommendations:    Adult ADHD Noting some increased anxiety, uneasiness, elevated blood pressure. We are going to decrease her Concerta to 27 mg.  Generalized anxiety disorder Decreasing Concerta, continue Celexa. We will continue the down taper on Concerta before considering increasing Celexa for her anxiety.  Annual physical exam Up-to-date on cervical cancer screening, breast cancer screening.  I spent 25 minutes with this patient, greater than 50% was face-to-face time counseling regarding the above diagnoses ___________________________________________ Gwen Her. Dianah Field, M.D., ABFM., CAQSM. Primary Care and [place] Instructor of [place] of [facility] of Medicine

## 2017-12-01 NOTE — Assessment & Plan Note (Signed)
Decreasing Concerta, continue Celexa. We will continue the down taper on Concerta before considering increasing Celexa for her anxiety.

## 2017-12-01 NOTE — Assessment & Plan Note (Signed)
Noting some increased anxiety, uneasiness, elevated blood pressure. We are going to decrease her Concerta to 27 mg.

## 2017-12-31 ENCOUNTER — Other Ambulatory Visit: Payer: Self-pay

## 2017-12-31 DIAGNOSIS — F909 Attention-deficit hyperactivity disorder, unspecified type: Secondary | ICD-10-CM

## 2017-12-31 MED ORDER — METHYLPHENIDATE HCL ER (OSM) 27 MG PO TBCR: 27.0000 mg | EXTENDED_RELEASE_TABLET | ORAL | 0 refills | Status: DC

## 2018-02-01 ENCOUNTER — Other Ambulatory Visit: Payer: Self-pay

## 2018-02-01 DIAGNOSIS — F909 Attention-deficit hyperactivity disorder, unspecified type: Secondary | ICD-10-CM

## 2018-02-01 MED ORDER — METHYLPHENIDATE HCL ER (OSM) 27 MG PO TBCR: 27.0000 mg | EXTENDED_RELEASE_TABLET | ORAL | 0 refills | Status: DC

## 2018-03-04 ENCOUNTER — Other Ambulatory Visit: Payer: Self-pay

## 2018-03-04 DIAGNOSIS — F909 Attention-deficit hyperactivity disorder, unspecified type: Secondary | ICD-10-CM

## 2018-03-04 MED ORDER — METHYLPHENIDATE HCL ER (OSM) 27 MG PO TBCR: 27.0000 mg | EXTENDED_RELEASE_TABLET | ORAL | 0 refills | Status: DC

## 2018-04-14 ENCOUNTER — Telehealth: Payer: Self-pay

## 2018-04-14 DIAGNOSIS — F909 Attention-deficit hyperactivity disorder, unspecified type: Secondary | ICD-10-CM

## 2018-04-14 MED ORDER — METHYLPHENIDATE HCL ER (OSM) 36 MG PO TBCR: 36.0000 mg | EXTENDED_RELEASE_TABLET | ORAL | 0 refills | Status: DC

## 2018-04-14 NOTE — Telephone Encounter (Signed)
Ok, sending in 36mg .

## 2018-04-14 NOTE — Telephone Encounter (Signed)
Pt left VM asking if she could go back up on her dose of methylphenidate. Please advise.

## 2018-05-19 ENCOUNTER — Other Ambulatory Visit: Payer: Self-pay

## 2018-05-19 DIAGNOSIS — F909 Attention-deficit hyperactivity disorder, unspecified type: Secondary | ICD-10-CM

## 2018-05-19 NOTE — Telephone Encounter (Signed)
Suella would like a refill on methylphenidate sent to [facility].

## 2018-05-20 MED ORDER — METHYLPHENIDATE HCL ER (OSM) 36 MG PO TBCR: 36.0000 mg | EXTENDED_RELEASE_TABLET | ORAL | 0 refills | Status: DC

## 2018-05-21 ENCOUNTER — Other Ambulatory Visit: Payer: Self-pay | Admitting: Sports Medicine

## 2018-05-21 DIAGNOSIS — F411 Generalized anxiety disorder: Secondary | ICD-10-CM

## 2018-06-07 ENCOUNTER — Telehealth: Payer: Self-pay | Admitting: Sports Medicine

## 2018-06-07 NOTE — Telephone Encounter (Signed)
PT needs wants to make sure she can get a refill when her medication runs out.  methylphenidate 36 MG PO CR tablet [[id number]]   Please advise

## 2018-06-22 ENCOUNTER — Other Ambulatory Visit: Payer: Self-pay

## 2018-06-22 DIAGNOSIS — F909 Attention-deficit hyperactivity disorder, unspecified type: Secondary | ICD-10-CM

## 2018-06-22 MED ORDER — METHYLPHENIDATE HCL ER (OSM) 36 MG PO TBCR: 36.0000 mg | EXTENDED_RELEASE_TABLET | ORAL | 0 refills | Status: DC

## 2018-07-21 ENCOUNTER — Other Ambulatory Visit: Payer: Self-pay | Admitting: Sports Medicine

## 2018-07-21 DIAGNOSIS — F909 Attention-deficit hyperactivity disorder, unspecified type: Secondary | ICD-10-CM

## 2018-07-21 MED ORDER — METHYLPHENIDATE HCL ER (OSM) 36 MG PO TBCR: 36.0000 mg | EXTENDED_RELEASE_TABLET | ORAL | 0 refills | Status: DC

## 2018-07-26 ENCOUNTER — Encounter: Payer: Self-pay | Admitting: Sports Medicine

## 2018-07-26 ENCOUNTER — Other Ambulatory Visit: Payer: Self-pay | Admitting: Sports Medicine

## 2018-07-26 DIAGNOSIS — E785 Hyperlipidemia, unspecified: Secondary | ICD-10-CM

## 2018-07-26 NOTE — Progress Notes (Signed)
Routine labs ordered for upcoming physical

## 2018-08-02 ENCOUNTER — Encounter: Payer: Self-pay | Admitting: Sports Medicine

## 2018-08-02 ENCOUNTER — Ambulatory Visit (INDEPENDENT_AMBULATORY_CARE_PROVIDER_SITE_OTHER): Payer: Managed Care, Other (non HMO) | Admitting: Sports Medicine

## 2018-08-02 ENCOUNTER — Other Ambulatory Visit: Payer: Self-pay

## 2018-08-02 VITALS — BP 146/83 | HR 103 | Ht 65.0 in | Wt 190.0 lb

## 2018-08-02 DIAGNOSIS — F411 Generalized anxiety disorder: Secondary | ICD-10-CM | POA: Diagnosis not present

## 2018-08-02 DIAGNOSIS — E785 Hyperlipidemia, unspecified: Secondary | ICD-10-CM

## 2018-08-02 DIAGNOSIS — Z0000 Encounter for general adult medical examination without abnormal findings: Secondary | ICD-10-CM | POA: Diagnosis not present

## 2018-08-02 DIAGNOSIS — G5603 Carpal tunnel syndrome, bilateral upper limbs: Secondary | ICD-10-CM | POA: Diagnosis not present

## 2018-08-02 DIAGNOSIS — L570 Actinic keratosis: Secondary | ICD-10-CM | POA: Diagnosis not present

## 2018-08-02 DIAGNOSIS — M67432 Ganglion, left wrist: Secondary | ICD-10-CM

## 2018-08-02 DIAGNOSIS — F909 Attention-deficit hyperactivity disorder, unspecified type: Secondary | ICD-10-CM | POA: Diagnosis not present

## 2018-08-02 NOTE — Assessment & Plan Note (Signed)
Unremarkable physical. She got her labs done earlier today.

## 2018-08-02 NOTE — Assessment & Plan Note (Signed)
Well-controlled, no changes in plan or dosage.

## 2018-08-02 NOTE — Assessment & Plan Note (Signed)
Currently wearing nighttime splinting, she has had injections by an outside provider. Is starting to get some progressive weakness, dropping objects, this is a good reason to discuss surgical intervention with her hand surgeon.

## 2018-08-02 NOTE — Assessment & Plan Note (Signed)
Dorsal ganglion, nontender, good motion, she will treat this conservatively, icing and activity modification.

## 2018-08-02 NOTE — Assessment & Plan Note (Signed)
History of left cheek actinic keratosis, she does have a flat lesion along her medial canthus on the right side of the nose, she will observe this for changes, today it appears to be a seborrheic keratosis.

## 2018-08-02 NOTE — Assessment & Plan Note (Signed)
Well-controlled, no changes in dosage or plan.

## 2018-08-02 NOTE — Progress Notes (Signed)
Subjective:    CC: Annual physical exam  HPI:  April [name] is here for her physical, she has several complaints.  She did get her routine labs done earlier today.  Wrist pain: Left-sided, localized over the dorsum of the wrist with a small lump, heading in the right direction.  Mild, improving.  No trauma.  Facial lesion: Right canthus over the right nasal bridge.  No change.  Anxiety: Controlled on current medication.  ADHD: Controlled on current medication.  Bilateral carpal tunnel syndrome: With bilateral hand numbness and tingling, currently has been doing nighttime splinting and this is followed by another provider.  She has had injections in the past without any improvement.  Starting to develop some weakness in both hands.  I reviewed the past medical history, family history, social history, surgical history, and allergies today and no changes were needed.  Please see the problem list section below in epic for further details.  Past Medical History: No past medical history on file. Past Surgical History: Past Surgical History:  Procedure Laterality Date  . ABLATION    . CESAREAN SECTION    . CHOLECYSTECTOMY    . TUBAL LIGATION     Social History: Social History   Socioeconomic History  . Marital status: Married    Spouse name: Not on file  . Number of children: Not on file  . Years of education: Not on file  . Highest education level: Not on file  Occupational History  . Not on file  Social Needs  . Financial resource strain: Not on file  . Food insecurity:    Worry: Not on file    Inability: Not on file  . Transportation needs:    Medical: Not on file    Non-medical: Not on file  Tobacco Use  . Smoking status: Former Research scientist (life sciences)  . Smokeless tobacco: Never Used  Substance and Sexual Activity  . Alcohol use: Yes    Comment: 2-4 q wk  . Drug use: No  . Sexual activity: Yes  Lifestyle  . Physical activity:    Days per week: Not on file    Minutes per session: Not  on file  . Stress: Not on file  Relationships  . Social connections:    Talks on phone: Not on file    Gets together: Not on file    Attends religious service: Not on file    Active member of club or organization: Not on file    Attends meetings of clubs or organizations: Not on file    Relationship status: Not on file  Other Topics Concern  . Not on file  Social History Narrative  . Not on file   Family History: Family History  Problem Relation Age of Onset  . Cancer Maternal Aunt    Allergies: No Known Allergies Medications: See med rec.  Review of Systems: No headache, visual changes, nausea, vomiting, diarrhea, constipation, dizziness, abdominal pain, skin rash, fevers, chills, night sweats, swollen lymph nodes, weight loss, chest pain, body aches, joint swelling, muscle aches, shortness of breath, mood changes, visual or auditory hallucinations.  Objective:    General: Well Developed, well nourished, and in no acute distress.  Neuro: Alert and oriented x3, extra-ocular muscles intact, sensation grossly intact. Cranial nerves II through XII are intact, motor, sensory, and coordinative functions are all intact. HEENT: Normocephalic, atraumatic, pupils equal round reactive to light, neck supple, no masses, no lymphadenopathy, thyroid nonpalpable. Oropharynx, nasopharynx, external ear canals are unremarkable. Skin: Warm and dry,  no rashes noted.  Cardiac: Regular rate and rhythm, no murmurs rubs or gallops.  Respiratory: Clear to auscultation bilaterally. Not using accessory muscles, speaking in full sentences.  Abdominal: Soft, nontender, nondistended, positive bowel sounds, no masses, no organomegaly.  Left wrist: Visible dorsal wrist ganglion without tenderness to palpation. ROM smooth and normal with good flexion and extension and ulnar/radial deviation that is symmetrical with opposite wrist. Palpation is normal over metacarpals, navicular, lunate, and TFCC; tendons  without tenderness/ swelling No snuffbox tenderness. No tenderness over Canal of [place]. Strength 5/5 in all directions without pain. Negative tinel's and phalens signs. Negative Finkelstein sign. Negative Watson's test.  Impression and Recommendations:    The patient was counselled, risk factors were discussed, anticipatory guidance given.  Annual physical exam Unremarkable physical. She got her labs done earlier today.  Actinic keratosis of left cheek History of left cheek actinic keratosis, she does have a flat lesion along her medial canthus on the right side of the nose, she will observe this for changes, today it appears to be a seborrheic keratosis.  Adult ADHD Well-controlled, no changes in plan or dosage.  Generalized anxiety disorder Well-controlled, no changes in dosage or plan.  Bilateral carpal tunnel syndrome Currently wearing nighttime splinting, she has had injections by an outside provider. Is starting to get some progressive weakness, dropping objects, this is a good reason to discuss surgical intervention with her hand surgeon.  Ganglion cyst of wrist, left Dorsal ganglion, nontender, good motion, she will treat this conservatively, icing and activity modification.  ___________________________________________ Gwen Her. Dianah Field, M.D., ABFM., CAQSM. Primary Care and [place] Instructor of [place] of [facility] of Medicine

## 2018-08-03 LAB — CBC
HCT: 45.2 % — ABNORMAL HIGH (ref 35.0–45.0)
Hemoglobin: 15.4 g/dL (ref 11.7–15.5)
MCH: 31 pg (ref 27.0–33.0)
MCHC: 34.1 g/dL (ref 32.0–36.0)
MCV: 90.9 fL (ref 80.0–100.0)
MPV: 12 fL (ref 7.5–12.5)
Platelets: 223 Thousand/uL (ref 140–400)
RBC: 4.97 10*6/uL (ref 3.80–5.10)
RDW: 12.7 % (ref 11.0–15.0)
WBC: 10.2 10*3/uL (ref 3.8–10.8)

## 2018-08-03 LAB — COMPREHENSIVE METABOLIC PANEL WITH GFR
AG Ratio: 2.2 (calc) (ref 1.0–2.5)
ALT: 14 U/L (ref 6–29)
AST: 13 U/L (ref 10–35)
BUN: 12 mg/dL (ref 7–25)
Creat: 0.71 mg/dL (ref 0.50–1.10)
Glucose, Bld: 107 mg/dL — ABNORMAL HIGH (ref 65–99)
Sodium: 143 mmol/L (ref 135–146)
Total Protein: 6.7 g/dL (ref 6.1–8.1)

## 2018-08-03 LAB — HEMOGLOBIN A1C
Hgb A1c MFr Bld: 5.7 %{Hb} — ABNORMAL HIGH (ref <5.7)
Mean Plasma Glucose: 117 (calc)
eAG (mmol/L): 6.5 (calc)

## 2018-08-03 LAB — LIPID PANEL W/REFLEX DIRECT LDL
Cholesterol: 237 mg/dL — ABNORMAL HIGH
HDL: 59 mg/dL
LDL Cholesterol (Calc): 151 mg/dL — ABNORMAL HIGH
Non-HDL Cholesterol (Calc): 178 mg/dL — ABNORMAL HIGH
Total CHOL/HDL Ratio: 4 (calc)
Triglycerides: 148 mg/dL

## 2018-08-03 LAB — TSH: TSH: 2.15 m[IU]/L

## 2018-08-03 LAB — COMPREHENSIVE METABOLIC PANEL
Albumin: 4.6 g/dL (ref 3.6–5.1)
Alkaline phosphatase (APISO): 71 U/L (ref 33–115)
CO2: 30 mmol/L (ref 20–32)
Calcium: 9.7 mg/dL (ref 8.6–10.2)
Chloride: 105 mmol/L (ref 98–110)
Globulin: 2.1 g/dL (calc) (ref 1.9–3.7)
Potassium: 4.7 mmol/L (ref 3.5–5.3)
Total Bilirubin: 0.5 mg/dL (ref 0.2–1.2)

## 2018-08-06 NOTE — Progress Notes (Signed)
Pt has seen results on MyChart and message also sent for patient to call back if any questions.

## 2018-08-23 ENCOUNTER — Encounter: Payer: Self-pay | Admitting: Sports Medicine

## 2018-08-23 DIAGNOSIS — F909 Attention-deficit hyperactivity disorder, unspecified type: Secondary | ICD-10-CM

## 2018-08-23 MED ORDER — METHYLPHENIDATE HCL ER (OSM) 36 MG PO TBCR: 36.0000 mg | EXTENDED_RELEASE_TABLET | ORAL | 0 refills | Status: DC

## 2018-08-28 ENCOUNTER — Encounter: Payer: Self-pay | Admitting: Sports Medicine

## 2018-09-21 ENCOUNTER — Encounter: Payer: Self-pay | Admitting: Sports Medicine

## 2018-09-21 DIAGNOSIS — F909 Attention-deficit hyperactivity disorder, unspecified type: Secondary | ICD-10-CM

## 2018-09-21 MED ORDER — METHYLPHENIDATE HCL ER (OSM) 36 MG PO TBCR: 36.0000 mg | EXTENDED_RELEASE_TABLET | ORAL | 0 refills | Status: DC

## 2018-10-07 LAB — HM PAP SMEAR: HM Pap smear: NEGATIVE

## 2018-10-20 ENCOUNTER — Other Ambulatory Visit: Payer: Self-pay | Admitting: Sports Medicine

## 2018-10-20 DIAGNOSIS — F909 Attention-deficit hyperactivity disorder, unspecified type: Secondary | ICD-10-CM

## 2018-10-21 MED ORDER — METHYLPHENIDATE HCL ER (OSM) 36 MG PO TBCR: 36.0000 mg | EXTENDED_RELEASE_TABLET | ORAL | 0 refills | Status: DC

## 2018-11-23 ENCOUNTER — Other Ambulatory Visit: Payer: Self-pay | Admitting: Sports Medicine

## 2018-11-23 DIAGNOSIS — F909 Attention-deficit hyperactivity disorder, unspecified type: Secondary | ICD-10-CM

## 2018-11-23 MED ORDER — METHYLPHENIDATE HCL ER (OSM) 36 MG PO TBCR: 36.0000 mg | EXTENDED_RELEASE_TABLET | ORAL | 0 refills | Status: DC

## 2018-11-25 NOTE — Telephone Encounter (Signed)
Received fax from [place] that Methylphenidate has been approved. Pharmacy aware.

## 2018-11-30 ENCOUNTER — Other Ambulatory Visit: Payer: Self-pay | Admitting: Sports Medicine

## 2018-11-30 DIAGNOSIS — F411 Generalized anxiety disorder: Secondary | ICD-10-CM

## 2018-12-20 ENCOUNTER — Encounter: Payer: Self-pay | Admitting: Physician Assistant

## 2018-12-20 ENCOUNTER — Other Ambulatory Visit: Payer: Self-pay | Admitting: Sports Medicine

## 2018-12-20 ENCOUNTER — Ambulatory Visit (INDEPENDENT_AMBULATORY_CARE_PROVIDER_SITE_OTHER): Payer: Managed Care, Other (non HMO) | Admitting: Physician Assistant

## 2018-12-20 ENCOUNTER — Ambulatory Visit (INDEPENDENT_AMBULATORY_CARE_PROVIDER_SITE_OTHER): Payer: Managed Care, Other (non HMO)

## 2018-12-20 VITALS — BP 149/87 | HR 97 | Temp 98.9°F | Ht 65.0 in | Wt 195.0 lb

## 2018-12-20 DIAGNOSIS — Z716 Tobacco abuse counseling: Secondary | ICD-10-CM

## 2018-12-20 DIAGNOSIS — I10 Essential (primary) hypertension: Secondary | ICD-10-CM

## 2018-12-20 DIAGNOSIS — R059 Cough, unspecified: Secondary | ICD-10-CM

## 2018-12-20 DIAGNOSIS — F909 Attention-deficit hyperactivity disorder, unspecified type: Secondary | ICD-10-CM

## 2018-12-20 DIAGNOSIS — R05 Cough: Secondary | ICD-10-CM

## 2018-12-20 DIAGNOSIS — F17200 Nicotine dependence, unspecified, uncomplicated: Secondary | ICD-10-CM

## 2018-12-20 DIAGNOSIS — J0140 Acute pansinusitis, unspecified: Secondary | ICD-10-CM

## 2018-12-20 MED ORDER — AMPHETAMINE-DEXTROAMPHET ER 20 MG PO CP24: 20.0000 mg | ORAL_CAPSULE | ORAL | 0 refills | Status: DC

## 2018-12-20 MED ORDER — FLUTICASONE PROPIONATE 50 MCG/ACT NA SUSP: 2.0000 | Freq: Every day | NASAL | 6 refills | Status: DC

## 2018-12-20 MED ORDER — METHYLPHENIDATE HCL ER (OSM) 36 MG PO TBCR: 36.0000 mg | EXTENDED_RELEASE_TABLET | ORAL | 0 refills | Status: DC

## 2018-12-20 MED ORDER — VARENICLINE TARTRATE 0.5 MG X 11 & 1 MG X 42 PO MISC: ORAL | 0 refills | Status: DC

## 2018-12-20 MED ORDER — BENZONATATE 200 MG PO CAPS: 200.0000 mg | ORAL_CAPSULE | Freq: Two times a day (BID) | ORAL | 0 refills | Status: DC | PRN

## 2018-12-20 MED ORDER — AMOXICILLIN-POT CLAVULANATE 875-125 MG PO TABS: 1.0000 | ORAL_TABLET | Freq: Two times a day (BID) | ORAL | 0 refills | Status: DC

## 2018-12-20 NOTE — Patient Instructions (Signed)

## 2018-12-20 NOTE — Progress Notes (Signed)
Call pt: lungs look great.

## 2018-12-20 NOTE — Progress Notes (Signed)
Subjective:    Patient ID: April [name], female    DOB: 1971/04/03, 48 y.o.   MRN: [id number]  HPI  Pt is a 48 yo female who presents to the clinic with cough, sinus pressure, ear popping headache for last 2 weeks. She thought she was getting a little better and then seemed to get worse. She is taking OTC tylenol/cold/sinus/severe with little relief. No fever, chills, SOB. She does feel a little "achy" today.   She has started back smoking for last year about 1 pack a day. She really wants to stop. She feels like starting concerta makes her want to start smoking more. She had quit for almost 10 years when she started concerta and wanted to start smoking again.   She is concerned about her ongoing cough. She feels like she has more and more "daily" cough and mucus. She wonders about her lungs.   . Active Ambulatory Problems    Diagnosis Date Noted  . Generalized anxiety disorder 03/15/2013  . Insomnia 03/15/2013  . Annual physical exam 03/15/2013  . Actinic keratosis of left cheek 03/17/2013  . Cervical radiculitis 05/05/2014  . Lumbar radiculitis 05/05/2014  . Obesity 06/02/2014  . Adult ADHD 11/15/2015  . Rosacea 11/15/2015  . Current smoker 02/12/2016  . Hyperlipidemia LDL goal <100 07/14/2017  . Diarrhea in adult patient 09/17/2017  . Bilateral carpal tunnel syndrome 08/02/2018  . Ganglion cyst of wrist, left 08/02/2018  . Essential hypertension 12/20/2018   Resolved Ambulatory Problems    Diagnosis Date Noted  . Exposure to chemical inhalation 01/08/2015  . Lateral epicondylitis of right elbow 06/28/2015  . Cellulitis and abscess of trunk 06/28/2015  . Cough 12/12/2015  . Hoarseness 06/10/2016  . Strain of calf muscle 08/15/2016  . Low vitamin D level 08/05/2017   No Additional Past Medical History      Review of Systems See HPI.     Objective:   Physical Exam Vitals signs reviewed.  Constitutional:      Appearance: Normal appearance.  HENT:     Head:  Normocephalic and atraumatic.     Comments: Tenderness over maxillary sinuses to palpation.     Right Ear: Tympanic membrane and ear canal normal.     Left Ear: Tympanic membrane and ear canal normal.     Nose: Nose normal. No congestion.     Mouth/Throat:     Mouth: Mucous membranes are moist.     Pharynx: Posterior oropharyngeal erythema present.  Eyes:     Conjunctiva/sclera: Conjunctivae normal.  Cardiovascular:     Rate and Rhythm: Normal rate and regular rhythm.  Pulmonary:     Effort: Pulmonary effort is normal.     Breath sounds: Normal breath sounds.  Neurological:     General: No focal deficit present.     Mental Status: She is alert and oriented to person, place, and time.  Psychiatric:        Mood and Affect: Mood normal.           Assessment & Plan:   Marland KitchenMarland KitchenTeletha was seen today for cough.  Diagnoses and all orders for this visit:  Acute non-recurrent pansinusitis -     amoxicillin-clavulanate (AUGMENTIN) 875-125 MG tablet; Take 1 tablet by mouth 2 (two) times daily. For 10 days. -     fluticasone (FLONASE) 50 MCG/ACT nasal spray; Place 2 sprays into both nostrils daily.  Cough -     benzonatate (TESSALON) 200 MG capsule; Take 1 capsule (200 mg total)  by mouth 2 (two) times daily as needed for cough. -     DG Chest 2 View  Adult ADHD -     amphetamine-dextroamphetamine (ADDERALL XR) 20 MG 24 hr capsule; Take 1 capsule (20 mg total) by mouth every morning.  Essential hypertension  Current smoker -     varenicline (CHANTIX STARTING MONTH PAK) 0.5 MG X 11 & 1 MG X 42 tablet; Take one 0.5mg  tablet by mouth once daily for 3 days, then increase to one 0.5mg  tablet twice daily for 3 days, then increase to one 1mg  tablet twice daily.  Encounter for smoking cessation counseling -     varenicline (CHANTIX STARTING MONTH PAK) 0.5 MG X 11 & 1 MG X 42 tablet; Take one 0.5mg  tablet by mouth once daily for 3 days, then increase to one 0.5mg  tablet twice daily for 3 days,  then increase to one 1mg  tablet twice daily.  BP not to goal today but sick visit. Follow up in 1 month with PCP.   Treated for sinus infection with abx and flonase.  Ordered CXR for cough.  Discussed follow up with pcp if ongoing cough and mucus production. Due to smoking hx needs spirometry.   Started chantix. Discussed side effects. Discussed 3 cessation methods. Coupon card given. Follow up with PCP in 1 month.   Will try different stimulant. Start adderall. Stop concerta. Follow up in 1 month.   Marland Kitchen.Spent 30 minutes with patient and greater than 50 percent of visit spent counseling patient regarding treatment plan.

## 2018-12-22 ENCOUNTER — Other Ambulatory Visit: Payer: Self-pay | Admitting: Physician Assistant

## 2019-01-14 ENCOUNTER — Encounter: Payer: Self-pay | Admitting: Sports Medicine

## 2019-01-17 ENCOUNTER — Ambulatory Visit: Payer: Self-pay | Admitting: Sports Medicine

## 2019-01-19 ENCOUNTER — Other Ambulatory Visit: Payer: Self-pay | Admitting: Physician Assistant

## 2019-01-19 DIAGNOSIS — F909 Attention-deficit hyperactivity disorder, unspecified type: Secondary | ICD-10-CM

## 2019-01-19 MED ORDER — AMPHETAMINE-DEXTROAMPHET ER 20 MG PO CP24: 20.0000 mg | ORAL_CAPSULE | ORAL | 0 refills | Status: DC

## 2019-02-18 ENCOUNTER — Other Ambulatory Visit: Payer: Self-pay | Admitting: Sports Medicine

## 2019-02-18 ENCOUNTER — Encounter: Payer: Self-pay | Admitting: Sports Medicine

## 2019-02-18 DIAGNOSIS — F909 Attention-deficit hyperactivity disorder, unspecified type: Secondary | ICD-10-CM

## 2019-02-18 MED ORDER — AMPHETAMINE-DEXTROAMPHET ER 20 MG PO CP24: 20.0000 mg | ORAL_CAPSULE | ORAL | 0 refills | Status: DC

## 2019-02-22 ENCOUNTER — Telehealth: Payer: Self-pay | Admitting: Sports Medicine

## 2019-02-22 ENCOUNTER — Encounter: Payer: Self-pay | Admitting: Sports Medicine

## 2019-02-22 NOTE — Telephone Encounter (Signed)
Patient has sent a MyChart message that Adderall was not covered by the insurance and I am waiting on a response. MyChart has been sent back to to patient to let her know the time frame for a response.

## 2019-02-22 NOTE — Telephone Encounter (Signed)
Received a fax from [place] that Adderall has been approved through 02/22/2020. Pharmacy aware and form sent to scan.

## 2019-02-24 ENCOUNTER — Other Ambulatory Visit: Payer: Self-pay | Admitting: Sports Medicine

## 2019-02-24 DIAGNOSIS — F411 Generalized anxiety disorder: Secondary | ICD-10-CM

## 2019-02-24 NOTE — Telephone Encounter (Signed)
Scheduled for Monday  °

## 2019-02-24 NOTE — Telephone Encounter (Signed)
Haven't seen her in 6 months, please convert to phone visit for celexa refill and PHQ.

## 2019-02-28 ENCOUNTER — Encounter: Payer: Self-pay | Admitting: Sports Medicine

## 2019-02-28 ENCOUNTER — Ambulatory Visit (INDEPENDENT_AMBULATORY_CARE_PROVIDER_SITE_OTHER): Payer: Managed Care, Other (non HMO) | Admitting: Sports Medicine

## 2019-02-28 DIAGNOSIS — E785 Hyperlipidemia, unspecified: Secondary | ICD-10-CM

## 2019-02-28 DIAGNOSIS — M5412 Radiculopathy, cervical region: Secondary | ICD-10-CM

## 2019-02-28 DIAGNOSIS — F411 Generalized anxiety disorder: Secondary | ICD-10-CM

## 2019-02-28 MED ORDER — CITALOPRAM HYDROBROMIDE 20 MG PO TABS: 30.0000 mg | ORAL_TABLET | Freq: Every day | ORAL | 1 refills | Status: DC

## 2019-02-28 MED ORDER — ATORVASTATIN CALCIUM 10 MG PO TABS: 10.0000 mg | ORAL_TABLET | Freq: Every day | ORAL | 1 refills | Status: DC

## 2019-02-28 MED ORDER — PREDNISONE 50 MG PO TABS: ORAL_TABLET | ORAL | 0 refills | Status: DC

## 2019-02-28 MED ORDER — CYCLOBENZAPRINE HCL 10 MG PO TABS: ORAL_TABLET | ORAL | 0 refills | Status: DC

## 2019-02-28 NOTE — Progress Notes (Signed)
Virtual Visit via Telephone   I connected with  April [name]  on 02/28/19 by telephone and verified that I am speaking with the correct person using two identifiers.   I discussed the limitations, risks, security and privacy concerns of performing an evaluation and management service by telephone, including the higher likelihood of inaccurate diagnosis and treatment, and the availability of in person appointments.  We also discussed the likely need of an additional face to face encounter for complete and high quality delivery of care.  I also discussed with the patient that there may be a patient responsible charge related to this service. The patient expressed understanding and wishes to proceed.  Subjective:    CC: Multiple issues  HPI: Anxiety and depression: Doing okay with Celexa 20, she tells me she does get occasional flares of anxiety and irritability and will occasionally take an extra 10 mg, no suicidal or homicidal ideation.  Neck pain: She is post cervical ACDF, persistent pain, never had improvement in pain after the surgery.  No progressive weakness, trauma, constitutional symptoms.  Hyperlipidemia: Never actually started her Lipitor.  I reviewed the past medical history, family history, social history, surgical history, and allergies today and no changes were needed.  Please see the problem list section below in epic for further details.  Past Medical History: No past medical history on file. Past Surgical History: Past Surgical History:  Procedure Laterality Date  . ABLATION    . CESAREAN SECTION    . CHOLECYSTECTOMY    . TUBAL LIGATION     Social History: Social History   Socioeconomic History  . Marital status: Married    Spouse name: Not on file  . Number of children: Not on file  . Years of education: Not on file  . Highest education level: Not on file  Occupational History  . Not on file  Social Needs  . Financial resource strain: Not on file  . Food  insecurity:    Worry: Not on file    Inability: Not on file  . Transportation needs:    Medical: Not on file    Non-medical: Not on file  Tobacco Use  . Smoking status: Current Every Day Smoker    Types: Cigarettes  . Smokeless tobacco: Never Used  Substance and Sexual Activity  . Alcohol use: Yes    Comment: 2-4 q wk  . Drug use: No  . Sexual activity: Yes  Lifestyle  . Physical activity:    Days per week: Not on file    Minutes per session: Not on file  . Stress: Not on file  Relationships  . Social connections:    Talks on phone: Not on file    Gets together: Not on file    Attends religious service: Not on file    Active member of club or organization: Not on file    Attends meetings of clubs or organizations: Not on file    Relationship status: Not on file  Other Topics Concern  . Not on file  Social History Narrative  . Not on file   Family History: Family History  Problem Relation Age of Onset  . Cancer Maternal Aunt    Allergies: No Known Allergies Medications: See med rec.  Review of Systems: No fevers, chills, night sweats, weight loss, chest pain, or shortness of breath.   Objective:    General: Speaking full sentences, no audible heavy breathing.  Sounds alert and appropriately interactive.  No other physical  exam performed due to the non-face to face nature of this visit.  Impression and Recommendations:    Generalized anxiety disorder Having increased anxiety, increasing to 30mg , refilling with 6 months.   Hyperlipidemia LDL goal <100 Adding atorva 10. Recheck in 3 months fasting.  Cervical radiculitis Post ACDF, persistent pain. This visit is not in person so it is difficult to actually make a diagnosis over the phone but I am going to add prednisone, Flexeril, formal PT, x-rays. I'd like to see her in person in 1 month.   I discussed the above assessment and treatment plan with the patient. The patient was provided an opportunity to ask  questions and all were answered. The patient agreed with the plan and demonstrated an understanding of the instructions.   The patient was advised to call back or seek an in-person evaluation if the symptoms worsen or if the condition fails to improve as anticipated.   I provided 21 minutes of non-face-to-face time during this encounter, less than 50% of this was time needed to gather information, review chart, records, and complete documentation.   ___________________________________________ Gwen Her. Dianah Field, M.D., ABFM., CAQSM. Primary Care and Sports Medicine [facility]  Adjunct Professor of [place] of [facility] of Medicine

## 2019-02-28 NOTE — Assessment & Plan Note (Signed)
Adding atorva 10. Recheck in 3 months fasting.

## 2019-02-28 NOTE — Assessment & Plan Note (Addendum)
Having increased anxiety, increasing to 30mg , refilling with 6 months.

## 2019-02-28 NOTE — Assessment & Plan Note (Signed)
Post ACDF, persistent pain. This visit is not in person so it is difficult to actually make a diagnosis over the phone but I am going to add prednisone, Flexeril, formal PT, x-rays. I'd like to see her in person in 1 month.

## 2019-03-14 ENCOUNTER — Encounter (INDEPENDENT_AMBULATORY_CARE_PROVIDER_SITE_OTHER): Payer: Managed Care, Other (non HMO) | Admitting: Sports Medicine

## 2019-03-14 DIAGNOSIS — K219 Gastro-esophageal reflux disease without esophagitis: Secondary | ICD-10-CM

## 2019-03-15 DIAGNOSIS — K219 Gastro-esophageal reflux disease without esophagitis: Secondary | ICD-10-CM | POA: Insufficient documentation

## 2019-03-15 MED ORDER — ESOMEPRAZOLE MAGNESIUM 40 MG PO CPDR: 40.0000 mg | DELAYED_RELEASE_CAPSULE | Freq: Every day | ORAL | 3 refills | Status: DC

## 2019-03-15 NOTE — Assessment & Plan Note (Signed)
Sounds like GERD, in the absence of melena, hematochezia, or hematemesis I think it is reasonable to add an acid blocker, we would try this for 6 weeks and if insufficient relief we do need to schedule you for an upper endoscopy and labs.   I am going to send in Nexium. She will avoid caffeine, chocolate, alcohol, spicy foods for now.

## 2019-03-15 NOTE — Telephone Encounter (Signed)
I spent 5 total minutes of online digital evaluation and management services. 

## 2019-03-17 ENCOUNTER — Encounter: Payer: Self-pay | Admitting: Sports Medicine

## 2019-03-22 ENCOUNTER — Other Ambulatory Visit: Payer: Self-pay | Admitting: Sports Medicine

## 2019-03-22 DIAGNOSIS — F909 Attention-deficit hyperactivity disorder, unspecified type: Secondary | ICD-10-CM

## 2019-03-22 MED ORDER — AMPHETAMINE-DEXTROAMPHET ER 20 MG PO CP24: 20.0000 mg | ORAL_CAPSULE | ORAL | 0 refills | Status: DC

## 2019-03-22 NOTE — Telephone Encounter (Signed)
Will forward to provider to review.

## 2019-03-29 ENCOUNTER — Other Ambulatory Visit: Payer: Self-pay | Admitting: Sports Medicine

## 2019-03-29 ENCOUNTER — Encounter: Payer: Self-pay | Admitting: Sports Medicine

## 2019-03-29 DIAGNOSIS — F909 Attention-deficit hyperactivity disorder, unspecified type: Secondary | ICD-10-CM

## 2019-04-02 ENCOUNTER — Encounter: Payer: Self-pay | Admitting: Sports Medicine

## 2019-04-26 ENCOUNTER — Other Ambulatory Visit: Payer: Self-pay | Admitting: Sports Medicine

## 2019-04-26 DIAGNOSIS — F909 Attention-deficit hyperactivity disorder, unspecified type: Secondary | ICD-10-CM

## 2019-04-26 MED ORDER — AMPHETAMINE-DEXTROAMPHET ER 20 MG PO CP24: 20.0000 mg | ORAL_CAPSULE | ORAL | 0 refills | Status: DC

## 2019-04-26 NOTE — Telephone Encounter (Signed)
Refill last sent 03/22/2019. Please advise.

## 2019-05-03 ENCOUNTER — Telehealth: Payer: Self-pay

## 2019-05-03 NOTE — Telephone Encounter (Signed)
April [name] called and states his wife, April [name], had an appointment with her eye doctor. She was just advised that the doctor and assistant tested positive for COVID-19. Her appointment was 8-9 days ago. The only symptoms she has is a migraine and a slight sore throat. Please advise. She is working and unable to answer the phone. Please contact her husband, April [name].

## 2019-05-03 NOTE — Telephone Encounter (Signed)
Self quarantine for a full 14 days after the exposure.  Let me know if any other symptoms develop.

## 2019-05-05 NOTE — Telephone Encounter (Signed)
Left a message advising of recommendations.  

## 2019-05-18 ENCOUNTER — Encounter: Payer: Self-pay | Admitting: Sports Medicine

## 2019-05-27 ENCOUNTER — Other Ambulatory Visit: Payer: Self-pay | Admitting: Sports Medicine

## 2019-05-27 DIAGNOSIS — F909 Attention-deficit hyperactivity disorder, unspecified type: Secondary | ICD-10-CM

## 2019-05-27 MED ORDER — AMPHETAMINE-DEXTROAMPHET ER 20 MG PO CP24: 20.0000 mg | ORAL_CAPSULE | ORAL | 0 refills | Status: DC

## 2019-05-27 NOTE — Telephone Encounter (Signed)
Last visit 02/28/19. No up coming appointments scheduled.

## 2019-06-27 ENCOUNTER — Other Ambulatory Visit: Payer: Self-pay | Admitting: Sports Medicine

## 2019-06-27 DIAGNOSIS — F909 Attention-deficit hyperactivity disorder, unspecified type: Secondary | ICD-10-CM

## 2019-06-27 MED ORDER — AMPHETAMINE-DEXTROAMPHET ER 20 MG PO CP24: 20.0000 mg | ORAL_CAPSULE | ORAL | 0 refills | Status: DC

## 2019-07-21 ENCOUNTER — Encounter: Payer: Self-pay | Admitting: Sports Medicine

## 2019-07-21 DIAGNOSIS — F411 Generalized anxiety disorder: Secondary | ICD-10-CM

## 2019-07-22 MED ORDER — CITALOPRAM HYDROBROMIDE 20 MG PO TABS: 30.0000 mg | ORAL_TABLET | Freq: Every day | ORAL | 0 refills | Status: DC

## 2019-07-26 ENCOUNTER — Encounter: Payer: Self-pay | Admitting: Sports Medicine

## 2019-07-26 ENCOUNTER — Other Ambulatory Visit: Payer: Self-pay | Admitting: Sports Medicine

## 2019-07-26 DIAGNOSIS — F909 Attention-deficit hyperactivity disorder, unspecified type: Secondary | ICD-10-CM

## 2019-07-26 DIAGNOSIS — E785 Hyperlipidemia, unspecified: Secondary | ICD-10-CM

## 2019-07-27 ENCOUNTER — Encounter: Payer: Self-pay | Admitting: Sports Medicine

## 2019-07-27 MED ORDER — AMPHETAMINE-DEXTROAMPHET ER 20 MG PO CP24: 20.0000 mg | ORAL_CAPSULE | ORAL | 0 refills | Status: DC

## 2019-07-30 LAB — COMPLETE METABOLIC PANEL WITH GFR
AG Ratio: 2.1 (calc) (ref 1.0–2.5)
ALT: 17 U/L (ref 6–29)
AST: 15 U/L (ref 10–35)
Albumin: 4.7 g/dL (ref 3.6–5.1)
Alkaline phosphatase (APISO): 64 U/L (ref 31–125)
BUN: 13 mg/dL (ref 7–25)
CO2: 28 mmol/L (ref 20–32)
Calcium: 9.8 mg/dL (ref 8.6–10.2)
Chloride: 104 mmol/L (ref 98–110)
Creat: 0.82 mg/dL (ref 0.50–1.10)
GFR, Est African American: 97 mL/min/{1.73_m2} (ref >=60)
GFR, Est Non African American: 84 mL/min/{1.73_m2} (ref >=60)
Globulin: 2.2 g/dL (calc) (ref 1.9–3.7)
Glucose, Bld: 107 mg/dL — ABNORMAL HIGH (ref 65–99)
Potassium: 4.8 mmol/L (ref 3.5–5.3)
Sodium: 143 mmol/L (ref 135–146)
Total Bilirubin: 0.4 mg/dL (ref 0.2–1.2)
Total Protein: 6.9 g/dL (ref 6.1–8.1)

## 2019-07-30 LAB — CBC
HCT: 41.3 % (ref 35.0–45.0)
Hemoglobin: 13.6 g/dL (ref 11.7–15.5)
MCH: 30.4 pg (ref 27.0–33.0)
MCHC: 32.9 g/dL (ref 32.0–36.0)
MCV: 92.4 fL (ref 80.0–100.0)
MPV: 12.1 fL (ref 7.5–12.5)
Platelets: 223 10*3/uL (ref 140–400)
RBC: 4.47 10*6/uL (ref 3.80–5.10)
RDW: 12.1 % (ref 11.0–15.0)
WBC: 7.3 10*3/uL (ref 3.8–10.8)

## 2019-07-30 LAB — HEMOGLOBIN A1C
Hgb A1c MFr Bld: 5.7 % of total Hgb — ABNORMAL HIGH (ref <5.7)
Mean Plasma Glucose: 117 (calc)
eAG (mmol/L): 6.5 (calc)

## 2019-07-30 LAB — LIPID PANEL W/REFLEX DIRECT LDL
Cholesterol: 170 mg/dL (ref <200)
HDL: 63 mg/dL (ref >=50)
LDL Cholesterol (Calc): 87 mg/dL (calc)
Non-HDL Cholesterol (Calc): 107 mg/dL (calc) (ref <130)
Total CHOL/HDL Ratio: 2.7 (calc) (ref <5.0)
Triglycerides: 100 mg/dL (ref <150)

## 2019-07-30 LAB — TSH: TSH: 2.93 mIU/L

## 2019-08-01 ENCOUNTER — Other Ambulatory Visit: Payer: Self-pay

## 2019-08-01 ENCOUNTER — Encounter: Payer: Self-pay | Admitting: Sports Medicine

## 2019-08-01 ENCOUNTER — Ambulatory Visit (INDEPENDENT_AMBULATORY_CARE_PROVIDER_SITE_OTHER): Payer: Managed Care, Other (non HMO) | Admitting: Sports Medicine

## 2019-08-01 VITALS — BP 137/83 | HR 102 | Ht 65.0 in | Wt 198.0 lb

## 2019-08-01 DIAGNOSIS — I10 Essential (primary) hypertension: Secondary | ICD-10-CM

## 2019-08-01 DIAGNOSIS — E669 Obesity, unspecified: Secondary | ICD-10-CM

## 2019-08-01 DIAGNOSIS — B351 Tinea unguium: Secondary | ICD-10-CM

## 2019-08-01 DIAGNOSIS — Z0000 Encounter for general adult medical examination without abnormal findings: Secondary | ICD-10-CM | POA: Diagnosis not present

## 2019-08-01 DIAGNOSIS — M5412 Radiculopathy, cervical region: Secondary | ICD-10-CM

## 2019-08-01 DIAGNOSIS — E785 Hyperlipidemia, unspecified: Secondary | ICD-10-CM

## 2019-08-01 DIAGNOSIS — F411 Generalized anxiety disorder: Secondary | ICD-10-CM

## 2019-08-01 DIAGNOSIS — L719 Rosacea, unspecified: Secondary | ICD-10-CM

## 2019-08-01 MED ORDER — CITALOPRAM HYDROBROMIDE 40 MG PO TABS: 40.0000 mg | ORAL_TABLET | Freq: Every day | ORAL | 3 refills | Status: DC

## 2019-08-01 MED ORDER — TERBINAFINE HCL 250 MG PO TABS: 250.0000 mg | ORAL_TABLET | Freq: Every day | ORAL | 1 refills | Status: AC

## 2019-08-01 NOTE — Assessment & Plan Note (Signed)
Unremarkable physical. She does have a strong family history of breast cancer and her gynecologist keeps her breast cancer screening up-to-date.

## 2019-08-01 NOTE — Assessment & Plan Note (Signed)
Under moderate control, increasing citalopram to 40 mg daily. She is having some increased menopausal hot flashes so this will help.

## 2019-08-01 NOTE — Progress Notes (Signed)
Subjective:    CC: Annual physical with multiple complaints  HPI:  Annual physical exam: She does get regular breast cancer screening due to strong family history of breast cancer, these are ordered by her gynecologist.  She is up-to-date on other screening measures.  Hyperlipidemia: Well-controlled, she did drop her atorvastatin dose to a half tab due to myalgias.  Things are feeling better.  Menopausal hot flashes: Very mild, she can live with them.  Generalized anxiety: Needs to go up on her Celexa dose, she has increasing irritability, anxiety, particularly with her children.  Toenail fungus:  Thickened and yellow, tried OTC creams to no avail.  I reviewed the past medical history, family history, social history, surgical history, and allergies today and no changes were needed.  Please see the problem list section below in epic for further details.  Past Medical History: No past medical history on file. Past Surgical History: Past Surgical History:  Procedure Laterality Date  . ABLATION    . CESAREAN SECTION    . CHOLECYSTECTOMY    . TUBAL LIGATION     Social History: Social History   Socioeconomic History  . Marital status: Married    Spouse name: Not on file  . Number of children: Not on file  . Years of education: Not on file  . Highest education level: Not on file  Occupational History  . Not on file  Social Needs  . Financial resource strain: Not on file  . Food insecurity    Worry: Not on file    Inability: Not on file  . Transportation needs    Medical: Not on file    Non-medical: Not on file  Tobacco Use  . Smoking status: Current Every Day Smoker    Types: Cigarettes  . Smokeless tobacco: Never Used  Substance and Sexual Activity  . Alcohol use: Yes    Comment: 2-4 q wk  . Drug use: No  . Sexual activity: Yes  Lifestyle  . Physical activity    Days per week: Not on file    Minutes per session: Not on file  . Stress: Not on file  Relationships   . Social Herbalist on phone: Not on file    Gets together: Not on file    Attends religious service: Not on file    Active member of club or organization: Not on file    Attends meetings of clubs or organizations: Not on file    Relationship status: Not on file  Other Topics Concern  . Not on file  Social History Narrative  . Not on file   Family History: Family History  Problem Relation Age of Onset  . Cancer Maternal Aunt    Allergies: No Known Allergies Medications: See med rec.  Review of Systems: No headache, visual changes, nausea, vomiting, diarrhea, constipation, dizziness, abdominal pain, skin rash, fevers, chills, night sweats, swollen lymph nodes, weight loss, chest pain, body aches, joint swelling, muscle aches, shortness of breath, mood changes, visual or auditory hallucinations.  Objective:    General: Well Developed, well nourished, and in no acute distress.  Neuro: Alert and oriented x3, extra-ocular muscles intact, sensation grossly intact. Cranial nerves II through XII are intact, motor, sensory, and coordinative functions are all intact. HEENT: Normocephalic, atraumatic, pupils equal round reactive to light, neck supple, no masses, no lymphadenopathy, thyroid nonpalpable. Oropharynx, nasopharynx, external ear canals are unremarkable. Skin: Warm and dry, no rashes noted.  Mild telangiectatic as well as  papulopustular rosacea noted over the face Cardiac: Regular rate and rhythm, no murmurs rubs or gallops.  Respiratory: Clear to auscultation bilaterally. Not using accessory muscles, speaking in full sentences.  Abdominal: Soft, nontender, nondistended, positive bowel sounds, no masses, no organomegaly.  Musculoskeletal: Shoulder, elbow, wrist, hip, knee, ankle stable, and with full range of motion.  Impression and Recommendations:    The patient was counselled, risk factors were discussed, anticipatory guidance given.  Annual physical exam  Unremarkable physical. She does have a strong family history of breast cancer and her gynecologist keeps her breast cancer screening up-to-date.  Cervical radiculitis Post ACDF, recurrence of pain, likely adjacent level disease, she will discuss this with her spine surgeon.  Essential hypertension Controlled, no changes.  Generalized anxiety disorder Under moderate control, increasing citalopram to 40 mg daily. She is having some increased menopausal hot flashes so this will help.  Hyperlipidemia LDL goal <100 Did have some myalgias with atorvastatin 10, she dropped down to 5 mg, lipids are controlled, myalgias have improved.  Rosacea Has unsuccessfully tried Clearasil, topical metronidazole, topical Finacea. She will discuss this further with her dermatologist.  Obesity Today we discussed intermittent fasting, we also discussed that the addition of protein supplements was simply the addition of extra calories.  Onychomycosis Adding Lamisil for 6 to 9 months   ___________________________________________ Gwen Her. Dianah Field, M.D., ABFM., CAQSM. Primary Care and Sports Medicine [place] [facility]  Adjunct Professor of [place] of [facility] of Medicine

## 2019-08-01 NOTE — Assessment & Plan Note (Signed)
Did have some myalgias with atorvastatin 10, she dropped down to 5 mg, lipids are controlled, myalgias have improved.

## 2019-08-01 NOTE — Assessment & Plan Note (Signed)
Has unsuccessfully tried Clearasil, topical metronidazole, topical Finacea. She will discuss this further with her dermatologist.

## 2019-08-01 NOTE — Assessment & Plan Note (Signed)
Controlled, no changes. 

## 2019-08-01 NOTE — Assessment & Plan Note (Signed)
Adding Lamisil for 6 to 9 months

## 2019-08-01 NOTE — Assessment & Plan Note (Signed)
Today we discussed intermittent fasting, we also discussed that the addition of protein supplements was simply the addition of extra calories.

## 2019-08-01 NOTE — Assessment & Plan Note (Signed)
Post ACDF, recurrence of pain, likely adjacent level disease, she will discuss this with her spine surgeon.

## 2019-08-04 ENCOUNTER — Other Ambulatory Visit: Payer: Self-pay | Admitting: *Deleted

## 2019-08-04 DIAGNOSIS — F411 Generalized anxiety disorder: Secondary | ICD-10-CM

## 2019-08-04 MED ORDER — CITALOPRAM HYDROBROMIDE 40 MG PO TABS: 40.0000 mg | ORAL_TABLET | Freq: Every day | ORAL | 0 refills | Status: DC

## 2019-08-23 ENCOUNTER — Other Ambulatory Visit: Payer: Self-pay | Admitting: Sports Medicine

## 2019-08-23 DIAGNOSIS — E785 Hyperlipidemia, unspecified: Secondary | ICD-10-CM

## 2019-08-24 ENCOUNTER — Ambulatory Visit (INDEPENDENT_AMBULATORY_CARE_PROVIDER_SITE_OTHER): Payer: Managed Care, Other (non HMO) | Admitting: Sports Medicine

## 2019-08-24 ENCOUNTER — Other Ambulatory Visit: Payer: Self-pay

## 2019-08-24 ENCOUNTER — Encounter: Payer: Self-pay | Admitting: Sports Medicine

## 2019-08-24 ENCOUNTER — Ambulatory Visit (INDEPENDENT_AMBULATORY_CARE_PROVIDER_SITE_OTHER): Payer: Managed Care, Other (non HMO)

## 2019-08-24 DIAGNOSIS — M2391 Unspecified internal derangement of right knee: Secondary | ICD-10-CM | POA: Diagnosis not present

## 2019-08-24 NOTE — Progress Notes (Signed)
Subjective:    CC: Right knee pain  HPI: For 3 weeks now this pleasant 48 year old female has had pain in the right knee, medial joint line with a palpable subcutaneous movable mass.  Moderate, persistent, localized without lesion.  No trauma, no mechanical symptoms.  I reviewed the past medical history, family history, social history, surgical history, and allergies today and no changes were needed.  Please see the problem list section below in epic for further details.  Past Medical History: No past medical history on file. Past Surgical History: Past Surgical History:  Procedure Laterality Date  . ABLATION    . CESAREAN SECTION    . CHOLECYSTECTOMY    . TUBAL LIGATION     Social History: Social History   Socioeconomic History  . Marital status: Married    Spouse name: Not on file  . Number of children: Not on file  . Years of education: Not on file  . Highest education level: Not on file  Occupational History  . Not on file  Social Needs  . Financial resource strain: Not on file  . Food insecurity    Worry: Not on file    Inability: Not on file  . Transportation needs    Medical: Not on file    Non-medical: Not on file  Tobacco Use  . Smoking status: Current Every Day Smoker    Types: Cigarettes  . Smokeless tobacco: Never Used  Substance and Sexual Activity  . Alcohol use: Yes    Comment: 2-4 q wk  . Drug use: No  . Sexual activity: Yes  Lifestyle  . Physical activity    Days per week: Not on file    Minutes per session: Not on file  . Stress: Not on file  Relationships  . Social Herbalist on phone: Not on file    Gets together: Not on file    Attends religious service: Not on file    Active member of club or organization: Not on file    Attends meetings of clubs or organizations: Not on file    Relationship status: Not on file  Other Topics Concern  . Not on file  Social History Narrative  . Not on file   Family History: Family  History  Problem Relation Age of Onset  . Cancer Maternal Aunt    Allergies: No Known Allergies Medications: See med rec.  Review of Systems: No fevers, chills, night sweats, weight loss, chest pain, or shortness of breath.   Objective:    General: Well Developed, well nourished, and in no acute distress.  Neuro: Alert and oriented x3, extra-ocular muscles intact, sensation grossly intact.  HEENT: Normocephalic, atraumatic, pupils equal round reactive to light, neck supple, no masses, no lymphadenopathy, thyroid nonpalpable.  Skin: Warm and dry, no rashes. Cardiac: Regular rate and rhythm, no murmurs rubs or gallops, no lower extremity edema.  Respiratory: Clear to auscultation bilaterally. Not using accessory muscles, speaking in full sentences. Right knee: Normal to inspection with no erythema or effusion or obvious bony abnormalities. Mild fluid wave, palpable intra-articular loose body medial to the patella. ROM normal in flexion and extension and lower leg rotation. Ligaments with solid consistent endpoints including ACL, PCL, LCL, MCL. Negative Mcmurray's and provocative meniscal tests. Non painful patellar compression. Patellar and quadriceps tendons unremarkable. Hamstring and quadriceps strength is normal.  Procedure: Real-time Ultrasound Guided injection of the right knee Device: GE Logiq E  Verbal informed consent obtained.  Time-out conducted.  Noted no overlying erythema, induration, or other signs of local infection.  Skin prepped in a sterile fashion.  Local anesthesia: Topical Ethyl chloride.  With sterile technique and under real time ultrasound guidance:  1 cc Kenalog 40, 2 cc lidocaine, 2 cc bupivacaine injected easily Completed without difficulty  Pain immediately resolved suggesting accurate placement of the medication.  Advised to call if fevers/chills, erythema, induration, drainage, or persistent bleeding.  Images permanently stored and available for  review in the ultrasound unit.  Impression: Technically successful ultrasound guided injection.  Impression and Recommendations:    Internal derangement of right knee Likely osteoarthritis with an intra-articular loose body sitting medial to the patella. Injection, x-rays, formal PT. Return in 1 month, MRI for arthroscopy planning if no better.   ___________________________________________ Gwen Her. Dianah Field, M.D., ABFM., CAQSM. Primary Care and Sports Medicine [place] [facility]  Adjunct Professor of [place] of [facility] of Medicine

## 2019-08-24 NOTE — Assessment & Plan Note (Signed)
Likely osteoarthritis with an intra-articular loose body sitting medial to the patella. Injection, x-rays, formal PT. Return in 1 month, MRI for arthroscopy planning if no better.

## 2019-08-25 ENCOUNTER — Encounter: Payer: Self-pay | Admitting: Sports Medicine

## 2019-08-25 DIAGNOSIS — L719 Rosacea, unspecified: Secondary | ICD-10-CM

## 2019-08-25 MED ORDER — AZELAIC ACID 15 % EX GEL: 1.0000 "application " | Freq: Two times a day (BID) | CUTANEOUS | 11 refills | Status: DC

## 2019-08-27 ENCOUNTER — Other Ambulatory Visit: Payer: Self-pay | Admitting: Sports Medicine

## 2019-08-27 DIAGNOSIS — F909 Attention-deficit hyperactivity disorder, unspecified type: Secondary | ICD-10-CM

## 2019-08-30 ENCOUNTER — Other Ambulatory Visit: Payer: Self-pay | Admitting: *Deleted

## 2019-08-30 DIAGNOSIS — F909 Attention-deficit hyperactivity disorder, unspecified type: Secondary | ICD-10-CM

## 2019-08-30 MED ORDER — AMPHETAMINE-DEXTROAMPHET ER 20 MG PO CP24: 20.0000 mg | ORAL_CAPSULE | ORAL | 0 refills | Status: DC

## 2019-09-25 ENCOUNTER — Encounter: Payer: Self-pay | Admitting: Sports Medicine

## 2019-09-26 ENCOUNTER — Ambulatory Visit: Payer: Managed Care, Other (non HMO) | Admitting: Sports Medicine

## 2019-09-29 ENCOUNTER — Other Ambulatory Visit: Payer: Self-pay | Admitting: Sports Medicine

## 2019-09-29 DIAGNOSIS — F909 Attention-deficit hyperactivity disorder, unspecified type: Secondary | ICD-10-CM

## 2019-09-29 MED ORDER — AMPHETAMINE-DEXTROAMPHET ER 20 MG PO CP24: 20.0000 mg | ORAL_CAPSULE | ORAL | 0 refills | Status: DC

## 2019-11-26 ENCOUNTER — Other Ambulatory Visit: Payer: Self-pay

## 2019-11-26 DIAGNOSIS — F909 Attention-deficit hyperactivity disorder, unspecified type: Secondary | ICD-10-CM

## 2019-11-28 MED ORDER — AMPHETAMINE-DEXTROAMPHET ER 20 MG PO CP24: 20.0000 mg | ORAL_CAPSULE | ORAL | 0 refills | Status: DC

## 2019-12-26 ENCOUNTER — Encounter: Payer: Self-pay | Admitting: Family Medicine

## 2019-12-26 ENCOUNTER — Other Ambulatory Visit: Payer: Self-pay

## 2019-12-26 ENCOUNTER — Ambulatory Visit (INDEPENDENT_AMBULATORY_CARE_PROVIDER_SITE_OTHER): Payer: Managed Care, Other (non HMO) | Admitting: Family Medicine

## 2019-12-26 DIAGNOSIS — S39012A Strain of muscle, fascia and tendon of lower back, initial encounter: Secondary | ICD-10-CM | POA: Diagnosis not present

## 2019-12-26 DIAGNOSIS — M5136 Other intervertebral disc degeneration, lumbar region: Secondary | ICD-10-CM | POA: Insufficient documentation

## 2019-12-26 MED ORDER — TIZANIDINE HCL 4 MG PO TABS: 2.0000 mg | ORAL_TABLET | Freq: Four times a day (QID) | ORAL | 0 refills | Status: DC | PRN

## 2019-12-26 MED ORDER — MELOXICAM 15 MG PO TABS: 15.0000 mg | ORAL_TABLET | Freq: Every day | ORAL | 0 refills | Status: DC

## 2019-12-26 NOTE — Patient Instructions (Signed)

## 2019-12-26 NOTE — Assessment & Plan Note (Addendum)
New Problem Discussed most back pain will self resolve within a few weeks.  Recommend icing and/or heat as needed for comfort.  Recommend avoiding staying stationary or sitting for prolonged periods of time.  Will add meloxicam and tizanidine as needed.  Given HEP handout.   Instructed to call or return of not improving or developing new or worsening symptoms.

## 2019-12-26 NOTE — Progress Notes (Signed)
April [name] - 49 y.o. female MRN [id number]  Date of birth: 17-Sep-1971  Subjective Chief Complaint  Patient presents with  . Back Pain    HPI April [name] is a a 49 y.o. female here today for acute visit with complaint of back pain.  She reports that her symptoms started a few days ago.  Located on L side.  She denies any known injury. 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  She has also been caring for her husband who recently had knee surgery.  She has never had anything like this in her lower back before.  She denies radiation of pain, numbness, tingling or weakness into her lower extremities.  She denies urinary symptoms or changes to bowels.  She tried ibuprofen and an expired muscle relaxer (unsure of name) without much improvement.  Pain does improve some when she gets up and moves around.  Stationary standing and when first getting up makes this worse.    ROS:  A comprehensive ROS was completed and negative except as noted per HPI  No Known Allergies  No past medical history on file.  Past Surgical History:  Procedure Laterality Date  . ABLATION    . CESAREAN SECTION    . CHOLECYSTECTOMY    . TUBAL LIGATION      Social History   Socioeconomic History  . Marital status: Married    Spouse name: Not on file  . Number of children: Not on file  . Years of education: Not on file  . Highest education level: Not on file  Occupational History  . Not on file  Tobacco Use  . Smoking status: Current Every Day Smoker    Types: Cigarettes  . Smokeless tobacco: Never Used  Substance and Sexual Activity  . Alcohol use: Yes    Comment: 2-4 q wk  . Drug use: No  . Sexual activity: Yes  Other Topics Concern  . Not on file  Social History Narrative  . Not on file   Social Determinants of Health   Financial Resource Strain:   . Difficulty of Paying Living Expenses: Not on file  Food Insecurity:   . Worried About Charity fundraiser in the Last Year: Not  on file  . Ran Out of Food in the Last Year: Not on file  Transportation Needs:   . Lack of Transportation (Medical): Not on file  . Lack of Transportation (Non-Medical): Not on file  Physical Activity:   . Days of Exercise per Week: Not on file  . Minutes of Exercise per Session: Not on file  Stress:   . Feeling of Stress : Not on file  Social Connections:   . Frequency of Communication with Friends and Family: Not on file  . Frequency of Social Gatherings with Friends and Family: Not on file  . Attends Religious Services: Not on file  . Active Member of Clubs or Organizations: Not on file  . Attends Archivist Meetings: Not on file  . Marital Status: Not on file    Family History  Problem Relation Age of Onset  . Cancer Maternal Aunt     Health Maintenance  Topic Date Due  . MAMMOGRAM  10/24/2019  . COLONOSCOPY  10/24/2019  . PAP SMEAR-Modifier  02/15/2022  . HIV Screening  Completed  . INFLUENZA VACCINE  Discontinued  . TETANUS/TDAP  Discontinued    ----------------------------------------------------------------------------------------------------------------------------------------------------------------------------------------------------------------- Physical Exam BP 129/85 (BP Location: Left Arm, Patient  Position: Sitting, Cuff Size: Normal)   Pulse 86   Temp 98.3 F (36.8 C) (Oral)   Wt 203 lb 1.9 oz (92.1 kg)   BMI 33.80 kg/m   Physical Exam Constitutional:      Appearance: Normal appearance.  HENT:     Head: Normocephalic and atraumatic.  Cardiovascular:     Rate and Rhythm: Normal rate and regular rhythm.  Pulmonary:     Effort: Pulmonary effort is normal.     Breath sounds: Normal breath sounds.  Abdominal:     Tenderness: There is no right CVA tenderness or left CVA tenderness.  Musculoskeletal:     Comments: ROM painful with sidbending and rotational movement to the L.  Flexion is without pain.  Mild pain with extension.  LE strength  is normal.  SLR negative.    Skin:    General: Skin is warm and dry.  Neurological:     General: No focal deficit present.     Mental Status: She is alert.  Psychiatric:        Mood and Affect: Mood normal.        Behavior: Behavior normal.     ------------------------------------------------------------------------------------------------------------------------------------------------------------------------------------------------------------------- Assessment and Plan  Lumbar strain, initial encounter New Problem Discussed most back pain will self resolve within a few weeks.  Recommend icing and/or heat as needed for comfort.  Recommend avoiding staying stationary or sitting for prolonged periods of time.  Will add meloxicam and tizanidine as needed.  Given HEP handout.   Instructed to call or return of not improving or developing new or worsening symptoms.     This visit occurred during the SARS-CoV-2 public health emergency.  Safety protocols were in place, including screening questions prior to the visit, additional usage of staff PPE, and extensive cleaning of exam room while observing appropriate contact time as indicated for disinfecting solutions.

## 2020-01-16 ENCOUNTER — Other Ambulatory Visit: Payer: Self-pay | Admitting: Sports Medicine

## 2020-01-16 MED ORDER — MELOXICAM 15 MG PO TABS: 15.0000 mg | ORAL_TABLET | Freq: Every day | ORAL | 1 refills | Status: DC

## 2020-01-17 ENCOUNTER — Other Ambulatory Visit: Payer: Self-pay | Admitting: Sports Medicine

## 2020-01-17 ENCOUNTER — Other Ambulatory Visit: Payer: Self-pay

## 2020-01-17 DIAGNOSIS — F411 Generalized anxiety disorder: Secondary | ICD-10-CM

## 2020-01-18 ENCOUNTER — Other Ambulatory Visit: Payer: Self-pay

## 2020-01-18 DIAGNOSIS — F411 Generalized anxiety disorder: Secondary | ICD-10-CM

## 2020-01-18 MED ORDER — CITALOPRAM HYDROBROMIDE 20 MG PO TABS: 30.0000 mg | ORAL_TABLET | Freq: Every day | ORAL | 3 refills | Status: DC

## 2020-01-26 ENCOUNTER — Other Ambulatory Visit: Payer: Self-pay | Admitting: Sports Medicine

## 2020-01-26 DIAGNOSIS — B351 Tinea unguium: Secondary | ICD-10-CM

## 2020-02-15 ENCOUNTER — Other Ambulatory Visit: Payer: Self-pay

## 2020-02-15 DIAGNOSIS — F909 Attention-deficit hyperactivity disorder, unspecified type: Secondary | ICD-10-CM

## 2020-02-15 MED ORDER — AMPHETAMINE-DEXTROAMPHET ER 20 MG PO CP24: 20.0000 mg | ORAL_CAPSULE | ORAL | 0 refills | Status: DC

## 2020-03-05 ENCOUNTER — Other Ambulatory Visit: Payer: Self-pay

## 2020-03-05 ENCOUNTER — Ambulatory Visit (INDEPENDENT_AMBULATORY_CARE_PROVIDER_SITE_OTHER): Payer: Managed Care, Other (non HMO) | Admitting: Sports Medicine

## 2020-03-05 ENCOUNTER — Ambulatory Visit (INDEPENDENT_AMBULATORY_CARE_PROVIDER_SITE_OTHER): Payer: Managed Care, Other (non HMO)

## 2020-03-05 ENCOUNTER — Encounter: Payer: Self-pay | Admitting: Sports Medicine

## 2020-03-05 VITALS — Ht 65.0 in | Wt 191.0 lb

## 2020-03-05 DIAGNOSIS — Z1211 Encounter for screening for malignant neoplasm of colon: Secondary | ICD-10-CM | POA: Diagnosis not present

## 2020-03-05 DIAGNOSIS — M5136 Other intervertebral disc degeneration, lumbar region: Secondary | ICD-10-CM

## 2020-03-05 DIAGNOSIS — Z0000 Encounter for general adult medical examination without abnormal findings: Secondary | ICD-10-CM | POA: Diagnosis not present

## 2020-03-05 DIAGNOSIS — M51369 Other intervertebral disc degeneration, lumbar region without mention of lumbar back pain or lower extremity pain: Secondary | ICD-10-CM

## 2020-03-05 MED ORDER — PREDNISONE 50 MG PO TABS: ORAL_TABLET | ORAL | 0 refills | Status: DC

## 2020-03-05 MED ORDER — MELOXICAM 15 MG PO TABS: ORAL_TABLET | ORAL | 3 refills | Status: DC

## 2020-03-05 NOTE — Assessment & Plan Note (Signed)
Up-to-date on mammogram, adding Cologuard.

## 2020-03-05 NOTE — Assessment & Plan Note (Signed)
This pleasant 49 year old female has had a recurrence of back pain, axial, worse with sitting, flexion, Valsalva, no bowel or bladder dysfunction, saddle numbness, constitutional symptoms, nothing radicular, midline axial. X-rays back in 2015 showed L3-L4 spondylosis, repeating x-rays today, adding 5 days of prednisone, meloxicam, home rehab exercises, return to see me in 4 weeks for this, MRI for interventional planning if no better.

## 2020-03-05 NOTE — Progress Notes (Signed)
    Procedures performed today:    None.  Independent interpretation of notes and tests performed by another provider:   None.  Brief History, Exam, Impression, and Recommendations:    Lumbar degenerative disc disease This pleasant 49 year old female has had a recurrence of back pain, axial, worse with sitting, flexion, Valsalva, no bowel or bladder dysfunction, saddle numbness, constitutional symptoms, nothing radicular, midline axial. X-rays back in 2015 showed L3-L4 spondylosis, repeating x-rays today, adding 5 days of prednisone, meloxicam, home rehab exercises, return to see me in 4 weeks for this, MRI for interventional planning if no better.  Annual physical exam Up-to-date on mammogram, adding Cologuard.    ___________________________________________ Gwen Her. Dianah Field, M.D., ABFM., CAQSM. Primary Care and [place] Instructor of [place] of [facility] of Medicine

## 2020-03-19 ENCOUNTER — Other Ambulatory Visit: Payer: Self-pay

## 2020-03-19 DIAGNOSIS — F909 Attention-deficit hyperactivity disorder, unspecified type: Secondary | ICD-10-CM

## 2020-03-19 MED ORDER — AMPHETAMINE-DEXTROAMPHET ER 20 MG PO CP24: 20.0000 mg | ORAL_CAPSULE | ORAL | 0 refills | Status: DC

## 2020-03-22 DIAGNOSIS — M51369 Other intervertebral disc degeneration, lumbar region without mention of lumbar back pain or lower extremity pain: Secondary | ICD-10-CM

## 2020-03-22 DIAGNOSIS — M4807 Spinal stenosis, lumbosacral region: Secondary | ICD-10-CM

## 2020-03-22 DIAGNOSIS — M5136 Other intervertebral disc degeneration, lumbar region: Secondary | ICD-10-CM

## 2020-03-30 NOTE — Telephone Encounter (Signed)
No, I told her if surgery becomes necessary then we would do the referral to a neurosurgeon.

## 2020-04-01 ENCOUNTER — Ambulatory Visit (INDEPENDENT_AMBULATORY_CARE_PROVIDER_SITE_OTHER): Payer: Managed Care, Other (non HMO)

## 2020-04-01 ENCOUNTER — Other Ambulatory Visit: Payer: Self-pay

## 2020-04-01 DIAGNOSIS — M5136 Other intervertebral disc degeneration, lumbar region: Secondary | ICD-10-CM

## 2020-04-01 DIAGNOSIS — M4807 Spinal stenosis, lumbosacral region: Secondary | ICD-10-CM | POA: Diagnosis not present

## 2020-04-04 DIAGNOSIS — M51369 Other intervertebral disc degeneration, lumbar region without mention of lumbar back pain or lower extremity pain: Secondary | ICD-10-CM

## 2020-04-04 DIAGNOSIS — M5136 Other intervertebral disc degeneration, lumbar region: Secondary | ICD-10-CM

## 2020-04-04 NOTE — Telephone Encounter (Signed)
Please contact [facility] for scheduling of epidural

## 2020-04-10 ENCOUNTER — Inpatient Hospital Stay: Admission: RE | Admit: 2020-04-10 | Payer: Managed Care, Other (non HMO) | Source: Ambulatory Visit

## 2020-04-11 ENCOUNTER — Ambulatory Visit
Admission: RE | Admit: 2020-04-11 | Discharge: 2020-04-11 | Disposition: A | Discharge status: Home / Self Care | Payer: Managed Care, Other (non HMO) | Source: Ambulatory Visit | Attending: Sports Medicine | Admitting: Sports Medicine

## 2020-04-11 ENCOUNTER — Other Ambulatory Visit: Payer: Self-pay

## 2020-04-11 DIAGNOSIS — M5136 Other intervertebral disc degeneration, lumbar region: Secondary | ICD-10-CM

## 2020-04-11 MED ORDER — METHYLPREDNISOLONE ACETATE 40 MG/ML INJ SUSP (RADIOLOG
120.0000 mg | Freq: Once | INTRAMUSCULAR | Status: AC
  Administered 2020-04-11: 120 mg via EPIDURAL

## 2020-04-11 MED ORDER — IOPAMIDOL (ISOVUE-M 200) INJECTION 41%
1.0000 mL | Freq: Once | INTRAMUSCULAR | Status: AC
  Administered 2020-04-11: 1 mL via EPIDURAL

## 2020-04-11 NOTE — Discharge Instructions (Signed)

## 2020-04-16 ENCOUNTER — Other Ambulatory Visit: Payer: Self-pay

## 2020-04-16 DIAGNOSIS — F909 Attention-deficit hyperactivity disorder, unspecified type: Secondary | ICD-10-CM

## 2020-04-17 MED ORDER — AMPHETAMINE-DEXTROAMPHET ER 20 MG PO CP24: 20.0000 mg | ORAL_CAPSULE | ORAL | 0 refills | Status: DC

## 2020-05-03 ENCOUNTER — Ambulatory Visit
Admission: RE | Admit: 2020-05-03 | Discharge: 2020-05-03 | Disposition: A | Discharge status: Home / Self Care | Payer: Managed Care, Other (non HMO) | Source: Ambulatory Visit | Attending: Sports Medicine | Admitting: Sports Medicine

## 2020-05-03 ENCOUNTER — Other Ambulatory Visit: Payer: Self-pay

## 2020-05-03 DIAGNOSIS — M5136 Other intervertebral disc degeneration, lumbar region: Secondary | ICD-10-CM

## 2020-05-03 MED ORDER — METHYLPREDNISOLONE ACETATE 40 MG/ML INJ SUSP (RADIOLOG
120.0000 mg | Freq: Once | INTRAMUSCULAR | Status: AC
  Administered 2020-05-03: 120 mg via EPIDURAL

## 2020-05-03 MED ORDER — IOPAMIDOL (ISOVUE-M 200) INJECTION 41%
1.0000 mL | Freq: Once | INTRAMUSCULAR | Status: AC
  Administered 2020-05-03: 1 mL via EPIDURAL

## 2020-05-03 NOTE — Discharge Instructions (Signed)

## 2020-05-16 ENCOUNTER — Other Ambulatory Visit: Payer: Self-pay

## 2020-05-16 DIAGNOSIS — F909 Attention-deficit hyperactivity disorder, unspecified type: Secondary | ICD-10-CM

## 2020-05-17 MED ORDER — AMPHETAMINE-DEXTROAMPHET ER 20 MG PO CP24: 20.0000 mg | ORAL_CAPSULE | ORAL | 0 refills | Status: DC

## 2020-05-29 ENCOUNTER — Encounter (INDEPENDENT_AMBULATORY_CARE_PROVIDER_SITE_OTHER): Payer: Managed Care, Other (non HMO)

## 2020-05-29 DIAGNOSIS — M5136 Other intervertebral disc degeneration, lumbar region: Secondary | ICD-10-CM

## 2020-05-29 DIAGNOSIS — M51369 Other intervertebral disc degeneration, lumbar region without mention of lumbar back pain or lower extremity pain: Secondary | ICD-10-CM

## 2020-05-30 NOTE — Assessment & Plan Note (Signed)
Persistent discomfort after 2 lumbar epidurals, referral to [facility].

## 2020-06-04 ENCOUNTER — Other Ambulatory Visit: Payer: Self-pay

## 2020-06-04 ENCOUNTER — Encounter: Payer: Self-pay | Admitting: Sports Medicine

## 2020-06-04 ENCOUNTER — Ambulatory Visit (INDEPENDENT_AMBULATORY_CARE_PROVIDER_SITE_OTHER): Payer: Managed Care, Other (non HMO) | Admitting: Sports Medicine

## 2020-06-04 DIAGNOSIS — M5136 Other intervertebral disc degeneration, lumbar region: Secondary | ICD-10-CM | POA: Diagnosis not present

## 2020-06-04 DIAGNOSIS — M51369 Other intervertebral disc degeneration, lumbar region without mention of lumbar back pain or lower extremity pain: Secondary | ICD-10-CM

## 2020-06-04 MED ORDER — DICLOFENAC SODIUM 75 MG PO TBEC: 75.0000 mg | DELAYED_RELEASE_TABLET | Freq: Two times a day (BID) | ORAL | 3 refills | Status: DC

## 2020-06-04 NOTE — Assessment & Plan Note (Signed)
This is a pleasant 49 year old female, she works in a Presenter, broadcasting. She does well whitening teeth and doing other work however doing hair tends to set off her back. She has to maintain a slightly flexed position for hours at a time, and the rest of her day she feels burning sensations in her mid thoracic spine in the paralumbar muscles, nothing radicular. She had several injections, nothing is improving her pain. She is also wearing a back brace at work. I explained her that I think this is more for work conditioning issue, that I would like her to do some aggressive formal physical therapy and take Voltaren before her shift. Return to see me in 6 weeks.

## 2020-06-04 NOTE — Progress Notes (Signed)
    Procedures performed today:    None.  Independent interpretation of notes and tests performed by another provider:   None.  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 She has to maintain a slightly flexed position for hours at a time, and the rest of her day she feels burning sensations in her mid thoracic spine in the paralumbar muscles, nothing radicular. She had several injections, nothing is improving her pain. She is also wearing a back brace at work. I explained her that I think this is more for work conditioning issue, that I would like her to do some aggressive formal physical therapy and take Voltaren before her shift. Return to see me in 6 weeks.    ___________________________________________ Gwen Her. Dianah Field, M.D., ABFM., CAQSM. Primary Care and [place] Instructor of [place] of [facility] of Medicine

## 2020-06-14 ENCOUNTER — Other Ambulatory Visit: Payer: Self-pay

## 2020-06-14 ENCOUNTER — Ambulatory Visit (INDEPENDENT_AMBULATORY_CARE_PROVIDER_SITE_OTHER): Payer: Managed Care, Other (non HMO) | Admitting: Physical Therapy

## 2020-06-14 ENCOUNTER — Encounter: Payer: Self-pay | Admitting: Physical Therapy

## 2020-06-14 DIAGNOSIS — M546 Pain in thoracic spine: Secondary | ICD-10-CM | POA: Diagnosis not present

## 2020-06-14 DIAGNOSIS — R29898 Other symptoms and signs involving the musculoskeletal system: Secondary | ICD-10-CM | POA: Diagnosis not present

## 2020-06-14 DIAGNOSIS — M6281 Muscle weakness (generalized): Secondary | ICD-10-CM

## 2020-06-14 NOTE — Patient Instructions (Signed)
Access Code: [email or web address] URL: [email or web address] Date: 06/14/2020 Prepared by: Jeral Pinch  Exercises Prone Scapular Retraction Arms at Side - 1 x daily - 3 sets - 10 reps Quadruped Thoracic Rotation - Reach Under - 1 x daily - 1 sets - 10 reps Quadruped Thoracic Rotation with Hand on Neck - 1 x daily - 10 reps Thoracic Extension Mobilization with Noodle - 1 x daily - 10 reps Shoulder External Rotation and Scapular Retraction with Resistance - 1 x daily - 2-3 sets - 10 reps

## 2020-06-14 NOTE — Therapy (Signed)
[facility], [place], [address] Phone: [phone]   Fax:  [phone]  Physical Therapy Evaluation  Patient Details  Name: April [name] MRN: [id number] Date of Birth: 06-14-1971 Referring Provider (PT):  Dr Dianah Field   Encounter Date: 06/14/2020   PT End of Session - 06/14/20 0850    Visit Number 1    Authorization Type [employer]    PT Start Time 0850    PT Stop Time 0931    PT Time Calculation (min) 41 min    Activity Tolerance Patient tolerated treatment well    Behavior During Therapy [facility] for tasks assessed/performed           History reviewed. No pertinent past medical history.  Past Surgical History:  Procedure Laterality Date  . ABLATION    . CESAREAN SECTION    . CHOLECYSTECTOMY    . TUBAL LIGATION      There were no vitals filed for this visit.    Subjective Assessment - 06/14/20 0850    Subjective Pt reports she has multiple disc issues, has had two injections and they recommended another however they aren't helping so she didn't want it.  She is currently working as a Theme park manager and the only time she has pain is when she is holding her arms up cutting hair and then the pain is excrutiating    Patient Stated Goals get rid pain so she can work without symptoms.    Currently in Pain? Yes    Pain Score 1    at its worse 10/10   Pain Location Thoracic    Pain Orientation Mid    Pain Descriptors / Indicators Sharp    Pain Type Chronic pain    Pain Onset More than a month ago    Pain Frequency Intermittent    Aggravating Factors  holding arms up    Pain Relieving Factors sterteching, foam rolling and massage              [facility] PT Assessment - 06/14/20 0001      Assessment   Medical Diagnosis back pain /thoracic    Referring Provider (PT)  Dr Dianah Field    Onset Date/Surgical Date 02/13/20    Hand Dominance Right    Next MD Visit PRN after PT     Prior Therapy yes for  elbow, not for back       Precautions   Precautions None      Balance Screen   Has the patient fallen in the past 6 months No      Prior Function   Level of Independence Independent    Vocation Full time employment    Vocation Requirements hair dresser    Leisure comes home in pain and can;t relax with family      Posture/Postural Control   Posture/Postural Control Postural limitations    Postural Limitations Rounded Shoulders;Increased thoracic kyphosis      ROM / Strength   AROM / PROM / Strength AROM;Strength      AROM   AROM Assessment Site Thoracic;Shoulder;Cervical    Right/Left Shoulder --   WNL   Cervical Flexion to chest     Cervical Extension WNL    Cervical - Right Rotation 70    Cervical - Left Rotation 62    Thoracic Flexion WNL - pain with over pressure    Thoracic Extension WNL    Thoracic - Right Rotation WNL     Thoracic - Left  Rotation WNL pain with overpressure - felt like she needed to adjust      Strength   Overall Strength Comments mid back 4/5 -     Strength Assessment Site Shoulder    Right/Left Shoulder --   WNL - except ER 4/5 bilat      Palpation   Spinal mobility hypomobile in thoracic spine with CPA and bilat UPA mobs                       Objective measurements completed on examination: See above findings.       [facility] Adult PT Treatment/Exercise - 06/14/20 0001      Exercises   Exercises Lumbar      Lumbar Exercises: Standing   Other Standing Lumbar Exercises shoulder ER with green band       Lumbar Exercises: Prone   Other Prone Lumbar Exercises 3x10 T's  palms down and palms up      Lumbar Exercises: Quadruped   Other Quadruped Lumbar Exercises thoracic mobilization/rotation to thread the needle       Manual Therapy   Manual Therapy Joint mobilization    Joint Mobilization grade III-IV CPA mobs T 4-10                        PT Long Term Goals - 06/14/20 1254      PT LONG TERM GOAL #1    Title I with HEP and with techniques to relieve upper back pain ( 07/26/2020)    Time 6    Period Weeks    Status New    Target Date 07/26/20      PT LONG TERM GOAL #2   Title demo full and painfree thoracic ROM ( 07/26/2020)    Time 6    Period Weeks    Status New    Target Date 07/26/20      PT LONG TERM GOAL #3   Title report =/> 75% reduction in thoracic pain while working ( 9/9/92021)    Time 6    Period Weeks    Status New    Target Date 07/26/20      PT LONG TERM GOAL #4   Title improve strength of mid back =/> 5-/5 to support upright posture at work ( 07/26/2020)    Time 6    Period Weeks    Status New    Target Date 07/26/20                  Plan - 06/14/20 0919    Clinical Impression Statement 49 yo female presents with c/o mid back pain that starts at work in between her shoulder blades when she is holding her arms up to cut hair and gets progressively worse during the day.  She does not have this pain when she is not working. Pt does have some weakness in her upper back, slight increased kyphosis and is hypomobile in the thoracic spine.  She would benefit from PT to improve thoracic mobility and strengthen her upper back    Personal Factors and Comorbidities Comorbidity 2    Examination-Activity Limitations Other    Examination-Participation Restrictions Other    Stability/Clinical Decision Making Stable/Uncomplicated    Clinical Decision Making Low    Rehab Potential Good    PT Frequency 1x / week    PT Duration 6 weeks    PT Treatment/Interventions Taping;Patient/family education;Functional mobility training;Moist Heat;Traction;Ultrasound;Therapeutic activities;Passive range of  motion;Therapeutic exercise;Cryotherapy;Electrical Stimulation;Neuromuscular re-education;Manual techniques;Spinal Manipulations;Dry needling    PT Next Visit Plan thoracic mobilizations, upper back strengthening    PT Home Exercise Plan [email or web address]    Consulted and Agree with Plan of  Care Patient           Patient will benefit from skilled therapeutic intervention in order to improve the following deficits and impairments:  Decreased range of motion, Pain, Hypomobility, Decreased strength  Visit Diagnosis: Pain in thoracic spine  Muscle weakness (generalized)  Other symptoms and signs involving the musculoskeletal system     Problem List Patient Active Problem List   Diagnosis Date Noted  . Lumbar degenerative disc disease 12/26/2019  . Internal derangement of right knee 08/24/2019  . Onychomycosis 08/01/2019  . GERD (gastroesophageal reflux disease) 03/15/2019  . Essential hypertension 12/20/2018  . Bilateral carpal tunnel syndrome 08/02/2018  . Hyperlipidemia LDL goal <100 07/14/2017  . Current smoker 02/12/2016  . Adult ADHD 11/15/2015  . Rosacea 11/15/2015  . Obesity 06/02/2014  . Cervical radiculitis 05/05/2014  . Actinic keratosis of left cheek 03/17/2013  . Generalized anxiety disorder 03/15/2013  . Insomnia 03/15/2013  . Annual physical exam 03/15/2013    Jeral Pinch PT  06/14/2020, 12:59 PM  [facility] [place], [place], [address] Phone: [phone]   Fax:  [phone]  Name: April [name] MRN: [id number] Date of Birth: 19-Apr-1971

## 2020-06-18 ENCOUNTER — Ambulatory Visit: Payer: Managed Care, Other (non HMO) | Admitting: Medical-Surgical

## 2020-06-21 NOTE — Telephone Encounter (Signed)
Pt is on the phone. She is calling again about getting something called in for her COVID cough.  Thanks

## 2020-06-22 ENCOUNTER — Emergency Department
Admission: EM | Admit: 2020-06-22 | Discharge: 2020-06-22 | Disposition: A | Discharge status: Home / Self Care | Payer: Managed Care, Other (non HMO) | Source: Home / Self Care | Attending: Family Medicine | Admitting: Family Medicine

## 2020-06-22 ENCOUNTER — Other Ambulatory Visit: Payer: Self-pay

## 2020-06-22 ENCOUNTER — Emergency Department (HOSPITAL_COMMUNITY)
Admission: EM | Admit: 2020-06-22 | Discharge: 2020-06-23 | Disposition: A | Discharge status: Home / Self Care | Payer: Managed Care, Other (non HMO) | Attending: Emergency Medicine | Admitting: Emergency Medicine

## 2020-06-22 ENCOUNTER — Emergency Department (INDEPENDENT_AMBULATORY_CARE_PROVIDER_SITE_OTHER): Payer: Managed Care, Other (non HMO)

## 2020-06-22 ENCOUNTER — Encounter (HOSPITAL_COMMUNITY): Payer: Self-pay

## 2020-06-22 DIAGNOSIS — H9201 Otalgia, right ear: Secondary | ICD-10-CM | POA: Insufficient documentation

## 2020-06-22 DIAGNOSIS — Z87891 Personal history of nicotine dependence: Secondary | ICD-10-CM | POA: Insufficient documentation

## 2020-06-22 DIAGNOSIS — R519 Headache, unspecified: Secondary | ICD-10-CM | POA: Insufficient documentation

## 2020-06-22 DIAGNOSIS — D696 Thrombocytopenia, unspecified: Secondary | ICD-10-CM

## 2020-06-22 DIAGNOSIS — G4485 Primary stabbing headache: Secondary | ICD-10-CM

## 2020-06-22 DIAGNOSIS — J1282 Pneumonia due to coronavirus disease 2019: Secondary | ICD-10-CM

## 2020-06-22 DIAGNOSIS — U071 COVID-19: Secondary | ICD-10-CM | POA: Diagnosis not present

## 2020-06-22 LAB — POCT CBC W AUTO DIFF (K'VILLE URGENT CARE)

## 2020-06-22 LAB — CBC
HCT: 40.1 % (ref 36.0–46.0)
Hemoglobin: 13.1 g/dL (ref 12.0–15.0)
MCH: 30.9 pg (ref 26.0–34.0)
MCHC: 32.7 g/dL (ref 30.0–36.0)
MCV: 94.6 fL (ref 80.0–100.0)
Platelets: 149 10*3/uL — ABNORMAL LOW (ref 150–400)
RBC: 4.24 MIL/uL (ref 3.87–5.11)
RDW: 13.2 % (ref 11.5–15.5)
WBC: 7 10*3/uL (ref 4.0–10.5)
nRBC: 0 % (ref 0.0–0.2)

## 2020-06-22 LAB — BASIC METABOLIC PANEL WITH GFR
Anion gap: 14 (ref 5–15)
BUN: 10 mg/dL (ref 6–20)
CO2: 24 mmol/L (ref 22–32)
Calcium: 9.3 mg/dL (ref 8.9–10.3)
Chloride: 100 mmol/L (ref 98–111)
Creatinine, Ser: 0.82 mg/dL (ref 0.44–1.00)
GFR calc Af Amer: >60 mL/min
GFR calc non Af Amer: >60 mL/min
Glucose, Bld: 110 mg/dL — ABNORMAL HIGH (ref 70–99)
Potassium: 4.3 mmol/L (ref 3.5–5.1)
Sodium: 138 mmol/L (ref 135–145)

## 2020-06-22 MED ORDER — ONDANSETRON 4 MG PO TBDP
4.0000 mg | ORAL_TABLET | Freq: Once | ORAL | Status: AC
  Administered 2020-06-22: 4 mg via ORAL
  Filled 2020-06-22: qty 1

## 2020-06-22 NOTE — ED Provider Notes (Signed)
[name]    CSN: [id number] Arrival date & time: 06/22/20  1303      History   Chief Complaint Chief Complaint  Patient presents with   Dizziness    HPI April [name] is a 49 y.o. female.   Six days ago patient developed sore throat, fatigue, low grade fever, chills, and malaise.  The next day she had a positive rapid COVID test.  She lost her sense of taste/smell 4 days ago followed by a cough three days ago.  She has intermittent dizziness without nausea/vomiting and has developed persistent intermittent stabbing pain in her right ear.  She complains of tightness in her anterior chest but no definite shortness of breath. Review of records reveals that patient had called her PCP yesterday requesting cough medication.  She was advised to seek evaluation in urgent care clinic.  The history is provided by the patient.    History reviewed:  See active problem list  Patient Active Problem List   Diagnosis Date Noted   Lumbar degenerative disc disease 12/26/2019   Internal derangement of right knee 08/24/2019   Onychomycosis 08/01/2019   GERD (gastroesophageal reflux disease) 03/15/2019   Essential hypertension 12/20/2018   Bilateral carpal tunnel syndrome 08/02/2018   Hyperlipidemia LDL goal <100 07/14/2017   Current smoker 02/12/2016   Adult ADHD 11/15/2015   Rosacea 11/15/2015   Obesity 06/02/2014   Cervical radiculitis 05/05/2014   Actinic keratosis of left cheek 03/17/2013   Generalized anxiety disorder 03/15/2013   Insomnia 03/15/2013   Annual physical exam 03/15/2013    Past Surgical History:  Procedure Laterality Date   ABLATION     CESAREAN SECTION     CHOLECYSTECTOMY     TUBAL LIGATION      OB History   No obstetric history on file.      Home Medications    Prior to Admission medications   Medication Sig Start Date End Date Taking? Authorizing Provider  amphetamine-dextroamphetamine (ADDERALL XR) 20 MG 24 hr  capsule Take 1 capsule (20 mg total) by mouth every morning. 05/17/20  Yes Silverio Decamp, MD  atorvastatin (LIPITOR) 10 MG tablet TAKE 1 TABLET (10 MG TOTAL) BY MOUTH DAILY AT 6 PM. Patient not taking: Reported on 06/23/2020 08/23/19  Yes Silverio Decamp, MD  Azelaic Acid 15 % cream Apply 1 application topically 2 (two) times daily. Patient not taking: Reported on 06/23/2020 08/25/19  Yes Silverio Decamp, MD  citalopram (CELEXA) 20 MG tablet Take 1.5 tablets (30 mg total) by mouth daily. 01/18/20  Yes Silverio Decamp, MD  diclofenac (VOLTAREN) 75 MG EC tablet Take 1 tablet (75 mg total) by mouth 2 (two) times daily. Patient not taking: Reported on 06/23/2020 06/04/20 06/04/21  Silverio Decamp, MD  ondansetron (ZOFRAN ODT) 4 MG disintegrating tablet Take 1 tablet (4 mg total) by mouth every 8 (eight) hours as needed for up to 3 days for nausea or vomiting. 06/23/20 06/26/20  Fatima Blank, MD    Family History Family History  Problem Relation Age of Onset   Cancer Maternal Aunt     Social History Social History   Tobacco Use   Smoking status: Former Smoker    Types: Cigarettes    Quit date: 06/23/2019    Years since quitting: 1.0   Smokeless tobacco: Never Used  Substance Use Topics   Alcohol use: Yes    Comment: 2-4 q wk   Drug use: No     Allergies  Lidocaine   Review of Systems Review of Systems + sore throat + cough No pleuritic pain, but feels tight in her anterior chest. ? wheezing + nasal congestion ? post-nasal drainage No sinus pain/pressure No itchy/red eyes + stabbing right earache + dizziness No hemoptysis No SOB + fever, + chills + nausea No vomiting No abdominal pain No diarrhea No urinary symptoms No skin rash + fatigue + myalgias  No headache (other than pain right ear) Used OTC meds (ibuprofen, Tylenol) without relief   Physical Exam Triage Vital Signs ED Triage Vitals  Enc Vitals Group     BP 06/22/20  1453 125/85     Pulse Rate 06/22/20 1453 97     Resp --      Temp 06/22/20 1453 98.8 F (37.1 C)     Temp Source 06/22/20 1453 Oral     SpO2 06/22/20 1603 98 %     Weight 06/22/20 1450 175 lb (79.4 kg)     Height 06/22/20 1450 5\' 5"  (1.651 m)     Head Circumference --      Peak Flow --      Pain Score 06/22/20 1449 7     Pain Loc --      Pain Edu? --      Excl. in [place]? --    No data found.  Updated Vital Signs BP 125/85 (BP Location: Right Arm)    Pulse 97    Temp 98.8 F (37.1 C) (Oral)    Ht 5\' 5"  (1.651 m)    Wt 79.4 kg    SpO2 98%    BMI 29.12 kg/m   Visual Acuity Right Eye Distance:   Left Eye Distance:   Bilateral Distance:    Right Eye Near:   Left Eye Near:    Bilateral Near:     Physical Exam Nursing notes and Vital Signs reviewed. Appearance:  Patient appears stated age and alert and oriented.  She is tearful during exam but in no acute distress Eyes:  Pupils are equal, round, and reactive to light and accomodation.  Extraocular movement is intact.  Conjunctivae are not inflamed  Ears:  Canals normal.  Tympanic membranes normal.  No tenderness to palpation right external ear or TMJ.  No right mastoid tenderness. Nose:  Mildly congested turbinates.  No sinus tenderness.  Pharynx:  Normal Neck:  Supple.  Shotty tender lateral nodes present.  Lungs:   Diffuse rhonchi all fields.  No rales or wheezes.  Breath sounds are equal.  Moving air well. Heart:  Regular rate and rhythm without murmurs, rubs, or gallops.  Abdomen:  Nontender without masses or hepatosplenomegaly.  Bowel sounds are present.  No CVA or flank tenderness.  Extremities:  No edema.  Skin:  No rash present.   UC Treatments / Results  Labs (all labs ordered are listed, but only abnormal results are displayed) Labs Reviewed  POCT CBC W AUTO DIFF ([place]):  WBC 7.1; LY 20.6; MO 1.6; GR 77.8; Hgb 13.8; Platelets 88   Tympanometry:  Right ear tympanogram normal; Left ear tympanogram  normal  EKG   Radiology DG Chest 2 View  Result Date: 06/22/2020 CLINICAL DATA:  URI for 6 days, COVID-19 positive EXAM: CHEST - 2 VIEW COMPARISON:  Radiograph 12/20/2018 FINDINGS: Mildly airways thickening with coarsened interstitial changes with some faint patchy opacities in the periphery of the right lung base and left mid lung. No pneumothorax or effusion. Pulmonary vascularity is normally distributed. Cardiomediastinal contours are  unremarkable. No acute osseous or soft tissue abnormality. Degenerative changes are present in the imaged spine and shoulders. Prior cervical fusion incompletely assessed on chest radiography. IMPRESSION: Mildly airways thickening with coarsened interstitial changes with some faint patchy opacities in the periphery of the right lung base and left mid lung, suspicious for multifocal pneumonia in the setting of COVID-19 positivity. Electronically Signed   By: Lovena Le M.D.   On: 06/22/2020 16:29    Procedures Procedures (including critical care time)  Medications Ordered in UC Medications - No data to display  Initial Impression / Assessment and Plan / UC Course  I have reviewed the triage vital signs and the nursing notes.  Pertinent labs & imaging results that were available during my care of the patient were reviewed by me and considered in my medical decision making (see chart for details).    Patient's primary complaint today is constant stabbing pain right ear for 5 to 6 days without evidence of otitis media.  Note COVID induced thrombocytopenia (88); concern for possible intracranial bleed causing pain in area of right ear. At patient's request will transport to [facility] ED for further evaluation.  Patient stable at time of discharge.   Final Clinical Impressions(s) / UC Diagnoses   Final diagnoses:  ANVBT-66 virus infection  Thrombocytopenia ([place])  Pneumonia due to COVID-19 virus  Acute otalgia, right   Discharge  Instructions   None    ED Prescriptions    None        Kandra Nicolas, MD 06/24/20 1138

## 2020-06-22 NOTE — Telephone Encounter (Signed)
Thank you :)

## 2020-06-22 NOTE — Telephone Encounter (Signed)
Patient sent to urgent care. We have no openings.

## 2020-06-22 NOTE — ED Notes (Signed)
Patient is being discharged from the Urgent Care and sent to the Emergency Department via [place] . Per Dr. Assunta Found, patient is in need of higher level of care due to possible hemorrhagic stroke r/t covid and severe right ear pain as well as CBC abormalities. Patient is aware and verbalizes understanding of plan of care. EMS states they will be transporting to [facility] ED. No neuro deficits during exam, patient alert and oriented x4. Vitals:   06/22/20 1453 06/22/20 1603  BP: 125/85   Pulse: 97   Temp: 98.8 F (37.1 C)   SpO2:  98%

## 2020-06-22 NOTE — ED Triage Notes (Addendum)
Pt arrives to ED w/ c/o low platlets. Sent by UC and PLT noted to be 88. Pt tested pos for covid 6 days ago.

## 2020-06-22 NOTE — ED Triage Notes (Signed)
Pt states that she has some dizziness and cough. Pt states that she has had symptoms since Sunday. x5 days.pt states that she hasn't been vaccinated.pt states that she is covid positive.

## 2020-06-22 NOTE — Telephone Encounter (Signed)
Agreed with a virtual visit, she needs to be evaluated to ensure this is safe to treat as an outpatient.

## 2020-06-23 MED ORDER — ACETAMINOPHEN 500 MG PO TABS
1000.0000 mg | ORAL_TABLET | Freq: Once | ORAL | Status: AC
  Administered 2020-06-23: 1000 mg via ORAL
  Filled 2020-06-23: qty 2

## 2020-06-23 MED ORDER — SODIUM CHLORIDE 0.9 % IV BOLUS
500.0000 mL | Freq: Once | INTRAVENOUS | Status: AC
  Administered 2020-06-23: 500 mL via INTRAVENOUS

## 2020-06-23 MED ORDER — ONDANSETRON 4 MG PO TBDP
4.0000 mg | ORAL_TABLET | Freq: Three times a day (TID) | ORAL | 0 refills | Status: AC | PRN
Start: 2020-06-23 — End: 2020-06-26

## 2020-06-23 NOTE — ED Provider Notes (Signed)
[facility] EMERGENCY DEPARTMENT Provider Note  CSN: [id number] Arrival date & time: 06/22/20 9563  Chief Complaint(s) Abnormal Lab  HPI April [name] is a 49 y.o. female with a past medical history listed below currently Covid positive diagnosed 1 week ago who presents from urgent care for evaluation of possible thrombocytopenia.  Patient went to the urgent care for intermittent right earlobe pain.  Pain has been ongoing for several days.  No alleviating or aggravating factors.  Patient endorsed some nausea and one episode of emesis while waiting in our emergency department.  This is nonbloody nonbilious.  No abdominal pain or diarrhea.  Patient is endorsing continued myalgias and fatigue.  Patient has tried hydrating at home.  She denies any focal deficits or visual disturbance.  While at urgent care part of their evaluation included CBC which noted a platelet count of 88.  HPI  Past Medical History History reviewed. No pertinent past medical history. Patient Active Problem List   Diagnosis Date Noted  . Lumbar degenerative disc disease 12/26/2019  . Internal derangement of right knee 08/24/2019  . Onychomycosis 08/01/2019  . GERD (gastroesophageal reflux disease) 03/15/2019  . Essential hypertension 12/20/2018  . Bilateral carpal tunnel syndrome 08/02/2018  . Hyperlipidemia LDL goal <100 07/14/2017  . Current smoker 02/12/2016  . Adult ADHD 11/15/2015  . Rosacea 11/15/2015  . Obesity 06/02/2014  . Cervical radiculitis 05/05/2014  . Actinic keratosis of left cheek 03/17/2013  . Generalized anxiety disorder 03/15/2013  . Insomnia 03/15/2013  . Annual physical exam 03/15/2013   Home Medication(s) Prior to Admission medications   Medication Sig Start Date End Date Taking? Authorizing Provider  amphetamine-dextroamphetamine (ADDERALL XR) 20 MG 24 hr capsule Take 1 capsule (20 mg total) by mouth every morning. 05/17/20  Yes Silverio Decamp, MD  citalopram  (CELEXA) 20 MG tablet Take 1.5 tablets (30 mg total) by mouth daily. 01/18/20  Yes Silverio Decamp, MD  atorvastatin (LIPITOR) 10 MG tablet TAKE 1 TABLET (10 MG TOTAL) BY MOUTH DAILY AT 6 PM. Patient not taking: Reported on 06/23/2020 08/23/19   Silverio Decamp, MD  Azelaic Acid 15 % cream Apply 1 application topically 2 (two) times daily. Patient not taking: Reported on 06/23/2020 08/25/19   Silverio Decamp, MD  diclofenac (VOLTAREN) 75 MG EC tablet Take 1 tablet (75 mg total) by mouth 2 (two) times daily. Patient not taking: Reported on 06/23/2020 06/04/20 06/04/21  Silverio Decamp, MD  ondansetron (ZOFRAN ODT) 4 MG disintegrating tablet Take 1 tablet (4 mg total) by mouth every 8 (eight) hours as needed for up to 3 days for nausea or vomiting. 06/23/20 06/26/20  Fatima Blank, MD                                                                                                                                    Past Surgical History Past Surgical History:  Procedure Laterality  Date  . ABLATION    . CESAREAN SECTION    . CHOLECYSTECTOMY    . TUBAL LIGATION     Family History Family History  Problem Relation Age of Onset  . Cancer Maternal Aunt     Social History Social History   Tobacco Use  . Smoking status: Former Smoker    Types: Cigarettes    Quit date: 06/23/2019    Years since quitting: 1.0  . Smokeless tobacco: Never Used  Substance Use Topics  . Alcohol use: Yes    Comment: 2-4 q wk  . Drug use: No   Allergies Lidocaine  Review of Systems Review of Systems All other systems are reviewed and are negative for acute change except as noted in the HPI  Physical Exam Vital Signs  I have reviewed the triage vital signs BP 128/78   Pulse 79   Temp 99.8 F (37.7 C) (Oral)   Resp 19   Ht 5\' 4"  (1.626 m)   Wt 79.4 kg   SpO2 94%   BMI 30.04 kg/m   Physical Exam Vitals reviewed.  Constitutional:      General: She is not in acute distress.     Appearance: She is well-developed. She is not diaphoretic.  HENT:     Head: Normocephalic and atraumatic.     Right Ear: External ear normal.     Left Ear: External ear normal.     Nose: Nose normal.  Eyes:     General: No scleral icterus.    Conjunctiva/sclera: Conjunctivae normal.  Neck:     Trachea: Phonation normal.   Cardiovascular:     Rate and Rhythm: Normal rate and regular rhythm.  Pulmonary:     Effort: Pulmonary effort is normal. No respiratory distress.     Breath sounds: No stridor.  Abdominal:     General: There is no distension.  Musculoskeletal:        General: Normal range of motion.     Cervical back: Normal range of motion. Muscular tenderness present.  Neurological:     Mental Status: She is alert and oriented to person, place, and time.  Psychiatric:        Behavior: Behavior normal.     ED Results and Treatments Labs (all labs ordered are listed, but only abnormal results are displayed) Labs Reviewed  CBC - Abnormal; Notable for the following components:      Result Value   Platelets 149 (*)    All other components within normal limits  BASIC METABOLIC PANEL - Abnormal; Notable for the following components:   Glucose, Bld 110 (*)    All other components within normal limits                                                                                                                         EKG  EKG Interpretation  Date/Time:    Ventricular Rate:    PR Interval:    QRS Duration:  QT Interval:    QTC Calculation:   R Axis:     Text Interpretation:        Radiology DG Chest 2 View  Result Date: 06/22/2020 CLINICAL DATA:  URI for 6 days, COVID-19 positive EXAM: CHEST - 2 VIEW COMPARISON:  Radiograph 12/20/2018 FINDINGS: Mildly airways thickening with coarsened interstitial changes with some faint patchy opacities in the periphery of the right lung base and left mid lung. No pneumothorax or effusion. Pulmonary vascularity is normally  distributed. Cardiomediastinal contours are unremarkable. No acute osseous or soft tissue abnormality. Degenerative changes are present in the imaged spine and shoulders. Prior cervical fusion incompletely assessed on chest radiography. IMPRESSION: Mildly airways thickening with coarsened interstitial changes with some faint patchy opacities in the periphery of the right lung base and left mid lung, suspicious for multifocal pneumonia in the setting of COVID-19 positivity. Electronically Signed   By: Lovena Le M.D.   On: 06/22/2020 16:29    Pertinent labs & imaging results that were available during my care of the patient were reviewed by me and considered in my medical decision making (see chart for details).  Medications Ordered in ED Medications  acetaminophen (TYLENOL) tablet 1,000 mg (has no administration in time range)  ondansetron (ZOFRAN-ODT) disintegrating tablet 4 mg (4 mg Oral Given 06/22/20 2031)  sodium chloride 0.9 % bolus 500 mL (500 mLs Intravenous New Bag/Given 06/23/20 0530)                                                                                                                                    Procedures Procedures  (including critical care time)  Medical Decision Making / ED Course I have reviewed the nursing notes for this encounter and the patient's prior records (if available in EHR or on provided paperwork).   Vandana Haman was evaluated in Emergency Department on 06/23/2020 for the symptoms described in the history of present illness. She was evaluated in the context of the global COVID-19 pandemic, which necessitated consideration that the patient might be at risk for infection with the SARS-CoV-2 virus that causes COVID-19. Institutional protocols and algorithms that pertain to the evaluation of patients at risk for COVID-19 are in a state of rapid change based on information released by regulatory bodies including the CDC and federal and state organizations.  These policies and algorithms were followed during the patient's care in the ED.  Covid positive patient with intermittent right-sided had/earlobe stabbing pain.  Pain is reproduced with palpation of the right cervical neck musculature.  Most suspicious for occipital neuralgia.  Given the intermittent nature of her pain that is sporadic and resolve spontaneously, I have low suspicion for intracranial hemorrhage.  Repeat labs showed a platelet count of 149.  Rest of the labs are reassuring without leukocytosis or anemia.  No significant electrolyte derangements or renal sufficiency.  Patient provided with small IV fluid bolus provided with oral hydration  as well.  She is able to tolerate.      Final Clinical Impression(s) / ED Diagnoses Final diagnoses:  COVID-19 virus infection  Stabbing headache   The patient appears reasonably screened and/or stabilized for discharge and I doubt any other medical condition or other [facility] requiring further screening, evaluation, or treatment in the ED at this time prior to discharge. Safe for discharge with strict return precautions.  Disposition: Discharge  Condition: Good  I have discussed the results, Dx and Tx plan with the patient/family who expressed understanding and agree(s) with the plan. Discharge instructions discussed at length. The patient/family was given strict return precautions who verbalized understanding of the instructions. No further questions at time of discharge.    ED Discharge Orders         Ordered    ondansetron (ZOFRAN ODT) 4 MG disintegrating tablet  Every 8 hours PRN     Discontinue  Reprint     06/23/20 0650             Follow Up: Silverio Decamp, MD [phone] [facility] [address] [place] [address] [phone]  Schedule an appointment as soon as possible for a visit  As needed      This chart was dictated using voice recognition software.  Despite best efforts to proofread,  errors can occur  which can change the documentation meaning.   Fatima Blank, MD 06/23/20 [phone]

## 2020-06-23 NOTE — ED Notes (Signed)
Patient verbalizes understanding of discharge instructions. Opportunity for questioning and answers were provided. Armband removed by staff, pt discharged from ED. Ambulated out to lobby  

## 2020-06-25 ENCOUNTER — Telehealth: Payer: Self-pay

## 2020-06-25 ENCOUNTER — Encounter: Payer: Managed Care, Other (non HMO) | Admitting: Physical Therapy

## 2020-06-25 NOTE — Telephone Encounter (Signed)
Pt has agreed to a virtual appt tomorrow at 1110 am. Per pt, she is having coughing fits due to Covid/pneumonia. She was not given any antibiotics during her visit to UC and ER on 06/22/20. Pt is requesting if provider can send in cough medicine to her pharmacy. She is aware that request may not be completed until she has her actual visit tomorrow. Pls advise, thanks.

## 2020-06-25 NOTE — Telephone Encounter (Addendum)
Pt's husband called requesting to speak with provider. Per Audry Pili, pt was diagnosed with Covid on 06/18/20. Pt's husband took pt to [facility] hospital on 06/22/20 due to severe symptoms. Was informed by hospital staff that pt's has pneumonia and dehydration. During call, pt's husband stated that his wife getting worse. Pt's husband was advised to return to the ER for a second evaluation. Pt's husband declined due to long waiting periods at the hospital. Requesting recommendation solely from provider. Pls advise, thanks.

## 2020-06-25 NOTE — Telephone Encounter (Signed)
Have her do a virtual visit with one of my partners.

## 2020-06-26 ENCOUNTER — Other Ambulatory Visit: Payer: Self-pay

## 2020-06-26 ENCOUNTER — Telehealth (INDEPENDENT_AMBULATORY_CARE_PROVIDER_SITE_OTHER): Payer: Managed Care, Other (non HMO) | Admitting: Medical-Surgical

## 2020-06-26 ENCOUNTER — Encounter: Payer: Self-pay | Admitting: Medical-Surgical

## 2020-06-26 VITALS — BP 126/77 | HR 74

## 2020-06-26 DIAGNOSIS — U071 COVID-19: Secondary | ICD-10-CM

## 2020-06-26 DIAGNOSIS — F909 Attention-deficit hyperactivity disorder, unspecified type: Secondary | ICD-10-CM

## 2020-06-26 MED ORDER — AMOXICILLIN 500 MG PO CAPS: 1000.0000 mg | ORAL_CAPSULE | Freq: Three times a day (TID) | ORAL | 0 refills | Status: AC

## 2020-06-26 MED ORDER — AMPHETAMINE-DEXTROAMPHET ER 20 MG PO CP24: 20.0000 mg | ORAL_CAPSULE | ORAL | 0 refills | Status: DC

## 2020-06-26 MED ORDER — FLUCONAZOLE 150 MG PO TABS: 150.0000 mg | ORAL_TABLET | Freq: Once | ORAL | 0 refills | Status: AC

## 2020-06-26 MED ORDER — GUAIFENESIN-CODEINE 100-10 MG/5ML PO SYRP: 5.0000 mL | ORAL_SOLUTION | Freq: Three times a day (TID) | ORAL | 0 refills | Status: DC | PRN

## 2020-06-26 MED ORDER — PREDNISONE 50 MG PO TABS: 50.0000 mg | ORAL_TABLET | Freq: Every day | ORAL | 0 refills | Status: DC

## 2020-06-26 MED ORDER — AZITHROMYCIN 250 MG PO TABS
ORAL_TABLET | ORAL | 0 refills | Status: DC
Start: 2020-06-26 — End: 2021-02-25

## 2020-06-26 NOTE — Progress Notes (Signed)
Virtual Visit via Video Note  I connected with [name] on 06/26/20 at 11:10 AM EDT by a video enabled telemedicine application and verified that I am speaking with the correct person using two identifiers.   I discussed the limitations of evaluation and management by telemedicine and the availability of in person appointments. The patient expressed understanding and agreed to proceed.  Patient location: home Provider locations: office  Subjective:    CC: COVID + with pneumonia   HPI: Pleasant 49 year old female presenting today with reports of testing positive for COVID on 06/17/2020. She has had upper respiratory symptoms, anosmia/aguesia, numbness of the face/mouth, intermittent nausea, and fever t-max 99.8. Was seen at urgent care on 8/6 for severe ear pain and found to have platelet abnormalities. UC sent her to the ED. There they determined she had pneumonia and dehydration. She was given a small fluid bolus and sent home with prn Zofran. Since then she has continued to experience a horrible cough with DOE and facial/sinus pressure in the frontal area. Cough is currently nonproductive but does cause some chest pain. She continues to feel weak and fatigued. Taking Nyquil, Mucinex, Motrin, and Tylenol with minimal to moderate relief. Nyquil helping her sleep some. Able to eat and drink okay but appetite is poor and taste/smell has not returned.   Past medical history, Surgical history, Family history not pertinant except as noted below, Social history, Allergies, and medications have been entered into the medical record, reviewed, and corrections made.   Review of Systems: See HPI for pertinent positives and negatives.   Objective:    General: Speaking clearly in complete sentences without any shortness of breath.  Alert and oriented x3.  Normal judgment. No apparent acute distress.  Impression and Recommendations:    1. COVID-19 virus infection Discussed COVID resolution  expectations and progress. Pneumonia identified at the hospital may be viral but unable to make that determination virtually. With patient being a current smoker, treating empirically for bacterial pneumonia with Amoxicillin and Azithromycin. Sending in burst dose of prednisone x 5 days. Robitussin AC sent to help manage cough. Patient prone to yeast infections with antibiotics so sending a dose of Diflucan. Continue symptomatic treatment, increase rest and fluids. Discussed appropriate quarantine time and transmission precaution practices. Reviewed emergency symptoms to be aware of that should prompt a return to UC or the ED.  Return if symptoms worsen or fail to improve.   20 minutes of non-face-to-face time was provided during this encounter.  I discussed the assessment and treatment plan with the patient. The patient was provided an opportunity to ask questions and all were answered. The patient agreed with the plan and demonstrated an understanding of the instructions.   The patient was advised to call back or seek an in-person evaluation if the symptoms worsen or if the condition fails to improve as anticipated.   Clearnce Sorrel, DNP, APRN, FNP-BC [facility]

## 2020-07-02 ENCOUNTER — Ambulatory Visit (INDEPENDENT_AMBULATORY_CARE_PROVIDER_SITE_OTHER): Payer: Managed Care, Other (non HMO) | Admitting: Physical Therapy

## 2020-07-02 ENCOUNTER — Other Ambulatory Visit: Payer: Self-pay

## 2020-07-02 DIAGNOSIS — M6281 Muscle weakness (generalized): Secondary | ICD-10-CM

## 2020-07-02 DIAGNOSIS — M546 Pain in thoracic spine: Secondary | ICD-10-CM

## 2020-07-02 DIAGNOSIS — R29898 Other symptoms and signs involving the musculoskeletal system: Secondary | ICD-10-CM | POA: Diagnosis not present

## 2020-07-02 NOTE — Therapy (Addendum)
[place], [place], [address] Phone: [phone]   Fax:  [phone]  Physical Therapy Treatment an dDischarge Summary  Patient Details  Name: April [name] MRN: [id number] Date of Birth: 28-Apr-1971 Referring Provider (PT):  Dr Dianah Field   Encounter Date: 07/02/2020   PT End of Session - 07/02/20 0934    Visit Number 2    Number of Visits 6    Authorization Type [employer]    PT Start Time 1950    PT Stop Time 1018    PT Time Calculation (min) 44 min    Activity Tolerance Patient tolerated treatment well;No increased pain    Behavior During Therapy [facility] for tasks assessed/performed           No past medical history on file.  Past Surgical History:  Procedure Laterality Date  . ABLATION    . CESAREAN SECTION    . CHOLECYSTECTOMY    . TUBAL LIGATION      There were no vitals filed for this visit.   Subjective Assessment - 07/02/20 1015    Subjective Pt has been ill; hasn't completed HEP but 3x since last visit.  Continued complaint with arms forward at shoulder height with work Therapist, occupational) and when using the computer.    Patient Stated Goals get rid pain so she can work without symptoms.    Currently in Pain? No/denies    Pain Score 0-No pain              [facility] PT Assessment - 07/02/20 0001      Assessment   Medical Diagnosis back pain /thoracic    Referring Provider (PT)  Dr Dianah Field    Onset Date/Surgical Date 02/13/20    Hand Dominance Right    Next MD Visit PRN after PT     Prior Therapy yes for elbow, not for back            [facility] Adult PT Treatment/Exercise - 07/02/20 0001      Self-Care   Self-Care Other Self-Care Comments    Other Self-Care Comments  pt educated on parameters, safe application, and rationale of TENS application; pt verbalized understanding.       Lumbar Exercises: Aerobic   UBE (Upper Arm Bike) L3: 1 min each direction / standing       Lumbar Exercises: Quadruped   Other Quadruped Lumbar Exercises thread the needle, and reach for ceiling x 8 reps each side.       Shoulder Exercises: Prone   Other Prone Exercises W's with axial ext x 10 reps      Shoulder Exercises: Sidelying   Other Sidelying Exercises open book R/L for thoracic/cervical rotation x 5 slow reps each side.       Shoulder Exercises: Standing   External Rotation Both;10 reps    Theraband Level (Shoulder External Rotation) Level 3 (Green)    External Rotation Limitations cues for form.     Extension Strengthening;Both;10 reps    Theraband Level (Shoulder Extension) Level 3 (Green)    Row [employer];Theraband    Theraband Level (Shoulder Row) Level 3 (Green)      Shoulder Exercises: Stretch   Other Shoulder Stretches 3 position doorway stretch x 20 sec x 2 reps each positon      Modalities   Modalities Electrical Stimulation;Moist Heat      Moist Heat Therapy   Number Minutes Moist Heat 10 Minutes    Moist Heat Location --  thoracic spine     Electrical Stimulation   Electrical Stimulation Location midthoracic paraspinals    Electrical Stimulation Action TENS    Electrical Stimulation Parameters intensity to tolerance x 10 min    Electrical Stimulation Goals Pain;Tone                       PT Long Term Goals - 07/02/20 1331      PT LONG TERM GOAL #1   Title I with HEP and with techniques to relieve upper back pain ( 07/26/2020)    Time 6    Period Weeks    Status On-going      PT LONG TERM GOAL #2   Title demo full and painfree thoracic ROM ( 07/26/2020)    Time 6    Period Weeks    Status On-going      PT LONG TERM GOAL #3   Title report =/> 75% reduction in thoracic pain while working ( 9/9/92021)    Time 6    Period Weeks    Status On-going      PT LONG TERM GOAL #4   Title improve strength of mid back =/> 5-/5 to support upright posture at work ( 07/26/2020)    Time 6    Period Weeks    Status On-going                   Plan - 07/02/20 1013    Clinical Impression Statement Pt has been away from therapy for 2 wks due to illness.  Pt tolerated all exercises well, without production of pain.  Shown self care tools to incorporate to her day when pain begins at work/ for after work.  No goals met yet; only the 2nd visit.    Personal Factors and Comorbidities Comorbidity 2    Examination-Activity Limitations Other    Examination-Participation Restrictions Other    Stability/Clinical Decision Making Stable/Uncomplicated    Rehab Potential Good    PT Frequency 1x / week    PT Duration 6 weeks    PT Treatment/Interventions Taping;Patient/family education;Functional mobility training;Moist Heat;Traction;Ultrasound;Therapeutic activities;Passive range of motion;Therapeutic exercise;Cryotherapy;Electrical Stimulation;Neuromuscular re-education;Manual techniques;Spinal Manipulations;Dry needling    PT Next Visit Plan thoracic mobilizations, upper back strengthening    PT Home Exercise Plan [email or web address]    Consulted and Agree with Plan of Care Patient           Patient will benefit from skilled therapeutic intervention in order to improve the following deficits and impairments:  Decreased range of motion, Pain, Hypomobility, Decreased strength  Visit Diagnosis: Pain in thoracic spine  Muscle weakness (generalized)  Other symptoms and signs involving the musculoskeletal system     Problem List Patient Active Problem List   Diagnosis Date Noted  . Lumbar degenerative disc disease 12/26/2019  . Internal derangement of right knee 08/24/2019  . Onychomycosis 08/01/2019  . GERD (gastroesophageal reflux disease) 03/15/2019  . Essential hypertension 12/20/2018  . Bilateral carpal tunnel syndrome 08/02/2018  . Hyperlipidemia LDL goal <100 07/14/2017  . Current smoker 02/12/2016  . Adult ADHD 11/15/2015  . Rosacea 11/15/2015  . Obesity 06/02/2014  . Cervical radiculitis 05/05/2014  . Actinic  keratosis of left cheek 03/17/2013  . Generalized anxiety disorder 03/15/2013  . Insomnia 03/15/2013  . Annual physical exam 03/15/2013    PHYSICAL THERAPY DISCHARGE SUMMARY  Visits from Start of Care: 2  Current functional level related to goals / functional outcomes: N/a-- did not return   Remaining  deficits: See note above for last known status   Education / Equipment: HEP  Plan: Patient agrees to discharge.  Patient goals were not met. Patient is being discharged due to not returning since the last visit.  ?????         Thank you for the referral of this patient. Rudell Cobb, MPT  Kerin Perna, PTA 07/02/20 1:35 PM  [facility] [address] [place], [place], [address] Phone: [phone]   Fax:  [phone]  Name: April [name] MRN: [id number] Date of Birth: 27-Sep-1971

## 2020-07-02 NOTE — Patient Instructions (Signed)
TENS UNIT: This is helpful for muscle pain and spasm.   Search and Purchase a TENS 7000 2nd edition at [employer]. It should be less than $30.     TENS unit instructions: Do not shower or bathe with the unit on Turn the unit off before removing electrodes or batteries If the electrodes lose stickiness add a drop of water to the electrodes after they are disconnected from the unit and place on plastic sheet. If you continued to have difficulty, call the TENS unit company to purchase more electrodes. Do not apply lotion on the skin area prior to use. Make sure the skin is clean and dry as this will help prolong the life of the electrodes. After use, always check skin for unusual red areas, rash or other skin difficulties. If there are any skin problems, does not apply electrodes to the same area. Never remove the electrodes from the unit by pulling the wires. Do not use the TENS unit or electrodes other than as directed. Do not change electrode placement without consultating your therapist or physician. Keep 2 fingers with between each electrode. Wear time ratio is 2:1, on to off times.    For example on for 30 minutes off for 15 minutes and then on for 30 minutes off for 15 minutes   * self massage with a ball to muscles in between shoulder blades x 3-5 min daily.  Resisted External Rotation: in Neutral - Bilateral   PALMS UP Sit or stand, tubing in both hands, elbows at sides, bent to 90, forearms forward. Pinch shoulder blades together and rotate forearms out. Keep elbows at sides. Repeat __10__ times per set. Do _2-3___ sets per session. Do _1___ sessions per day.   Low Row: Standing   Face anchor, feet shoulder width apart. Palms up, pull arms back, squeezing shoulder blades together. Repeat 10__ times per set. Do 2-3__ sets per session. Do 2-3__ sessions per week. Anchor Height: Waist     Strengthening: Resisted Extension   Hold tubing in right hand, arm forward.  Pull arm back, elbow straight. Repeat _10___ times per set. Do 2-3____ sets per session. Do 3____ sessions per week

## 2020-07-09 ENCOUNTER — Encounter: Payer: Managed Care, Other (non HMO) | Admitting: Physical Therapy

## 2020-07-31 ENCOUNTER — Other Ambulatory Visit: Payer: Self-pay

## 2020-07-31 DIAGNOSIS — F909 Attention-deficit hyperactivity disorder, unspecified type: Secondary | ICD-10-CM

## 2020-07-31 MED ORDER — AMPHETAMINE-DEXTROAMPHET ER 20 MG PO CP24: 20.0000 mg | ORAL_CAPSULE | ORAL | 0 refills | Status: DC

## 2020-09-04 ENCOUNTER — Other Ambulatory Visit: Payer: Self-pay

## 2020-09-04 DIAGNOSIS — F909 Attention-deficit hyperactivity disorder, unspecified type: Secondary | ICD-10-CM

## 2020-09-04 MED ORDER — AMPHETAMINE-DEXTROAMPHET ER 20 MG PO CP24: 20.0000 mg | ORAL_CAPSULE | ORAL | 0 refills | Status: DC

## 2020-10-01 ENCOUNTER — Telehealth: Payer: Self-pay | Admitting: Sports Medicine

## 2020-10-01 ENCOUNTER — Other Ambulatory Visit: Payer: Self-pay

## 2020-10-01 DIAGNOSIS — E785 Hyperlipidemia, unspecified: Secondary | ICD-10-CM

## 2020-10-01 DIAGNOSIS — F909 Attention-deficit hyperactivity disorder, unspecified type: Secondary | ICD-10-CM

## 2020-10-01 NOTE — Telephone Encounter (Signed)
Orders placed.

## 2020-10-01 NOTE — Telephone Encounter (Signed)
Patient called to schedule annual Physical & was requesting Lab orders to be put in before her appt. AM

## 2020-10-01 NOTE — Telephone Encounter (Signed)
Patient aware the orders are in and verified lab hours.

## 2020-10-03 MED ORDER — AMPHETAMINE-DEXTROAMPHET ER 20 MG PO CP24: 20.0000 mg | ORAL_CAPSULE | ORAL | 0 refills | Status: DC

## 2020-10-04 DIAGNOSIS — E785 Hyperlipidemia, unspecified: Secondary | ICD-10-CM

## 2020-10-05 NOTE — Assessment & Plan Note (Signed)
Cholesterol was doing well until her coach told her to stop her cholesterol medicine.  Now cholesterol has worsened significantly, I advised her to stop listening to lay people for medical advice.

## 2020-10-07 ENCOUNTER — Other Ambulatory Visit: Payer: Self-pay | Admitting: Sports Medicine

## 2020-10-07 DIAGNOSIS — E785 Hyperlipidemia, unspecified: Secondary | ICD-10-CM

## 2020-10-08 ENCOUNTER — Ambulatory Visit (INDEPENDENT_AMBULATORY_CARE_PROVIDER_SITE_OTHER): Payer: Managed Care, Other (non HMO) | Admitting: Sports Medicine

## 2020-10-08 ENCOUNTER — Encounter: Payer: Self-pay | Admitting: Sports Medicine

## 2020-10-08 VITALS — BP 142/83 | HR 87 | Ht 64.0 in | Wt 183.0 lb

## 2020-10-08 DIAGNOSIS — Z0000 Encounter for general adult medical examination without abnormal findings: Secondary | ICD-10-CM

## 2020-10-08 DIAGNOSIS — L821 Other seborrheic keratosis: Secondary | ICD-10-CM | POA: Diagnosis not present

## 2020-10-08 NOTE — Progress Notes (Addendum)
    Procedures performed today:    Procedure:  Cryodestruction of right-sided upper nasal/medial canthal seborrheic keratosis Consent obtained and verified. Time-out conducted. Noted no overlying erythema, induration, or other signs of local infection. Completed without difficulty using Cryo-Gun. Advised to call if fevers/chills, erythema, induration, drainage, or persistent bleeding.  Independent interpretation of notes and tests performed by another provider:   None.  Brief History, Exam, Impression, and Recommendations:    Annual physical exam Routine annual physical as above, she still needs to do her Cologuard testing, she has the kit. Too young for pneumococcal vaccine. We are going to check her varicella-zoster antibodies to help determine if she needs her Shingrix vaccine or the chickenpox vaccine. We will try to add these to blood already in the lab, the blood was obtained on Thursday of last week.  Varicella-zoster antibodies are elevated indicating past exposure to chickenpox, this makes her a candidate for the Shingrix vaccine.  She should have a nurse visit to have this done.  Seborrheic keratosis right side of the nose near the medial canthus There is a seborrheic keratosis on the right side of the nose near the medial canthus, cryotherapy performed today. Return in a couple of weeks for repeat if needed.    ___________________________________________ Gwen Her. Dianah Field, M.D., ABFM., CAQSM. Primary Care and [employer] Instructor of [place] [facility]

## 2020-10-08 NOTE — Assessment & Plan Note (Signed)
There is a seborrheic keratosis on the right side of the nose near the medial canthus, cryotherapy performed today. Return in a couple of weeks for repeat if needed.

## 2020-10-08 NOTE — Assessment & Plan Note (Addendum)
Routine annual physical as above, she still needs to do her Cologuard testing, she has the kit. Too young for pneumococcal vaccine. We are going to check her varicella-zoster antibodies to help determine if she needs her Shingrix vaccine or the chickenpox vaccine. We will try to add these to blood already in the lab, the blood was obtained on Thursday of last week.  Varicella-zoster antibodies are elevated indicating past exposure to chickenpox, this makes her a candidate for the Shingrix vaccine.  She should have a nurse visit to have this done.

## 2020-10-09 LAB — CBC
HCT: 43.5 % (ref 35.0–45.0)
Hemoglobin: 14.2 g/dL (ref 11.7–15.5)
MCH: 30.2 pg (ref 27.0–33.0)
MCHC: 32.6 g/dL (ref 32.0–36.0)
MCV: 92.6 fL (ref 80.0–100.0)
MPV: 11.6 fL (ref 7.5–12.5)
Platelets: 238 10*3/uL (ref 140–400)
RBC: 4.7 10*6/uL (ref 3.80–5.10)
RDW: 12 % (ref 11.0–15.0)
WBC: 5 10*3/uL (ref 3.8–10.8)

## 2020-10-09 LAB — COMPREHENSIVE METABOLIC PANEL
AG Ratio: 2.2 (calc) (ref 1.0–2.5)
ALT: 15 U/L (ref 6–29)
AST: 16 U/L (ref 10–35)
Albumin: 4.8 g/dL (ref 3.6–5.1)
Alkaline phosphatase (APISO): 61 U/L (ref 37–153)
BUN: 17 mg/dL (ref 7–25)
CO2: 32 mmol/L (ref 20–32)
Calcium: 9.9 mg/dL (ref 8.6–10.4)
Chloride: 103 mmol/L (ref 98–110)
Creat: 0.78 mg/dL (ref 0.50–1.05)
Globulin: 2.2 g/dL (calc) (ref 1.9–3.7)
Glucose, Bld: 103 mg/dL — ABNORMAL HIGH (ref 65–99)
Potassium: 4.7 mmol/L (ref 3.5–5.3)
Sodium: 142 mmol/L (ref 135–146)
Total Bilirubin: 0.5 mg/dL (ref 0.2–1.2)
Total Protein: 7 g/dL (ref 6.1–8.1)

## 2020-10-09 LAB — LIPID PANEL
Cholesterol: 257 mg/dL — ABNORMAL HIGH (ref <200)
HDL: 80 mg/dL (ref >=50)
LDL Cholesterol (Calc): 158 mg/dL (calc) — ABNORMAL HIGH
Non-HDL Cholesterol (Calc): 177 mg/dL (calc) — ABNORMAL HIGH (ref <130)
Total CHOL/HDL Ratio: 3.2 (calc) (ref <5.0)
Triglycerides: 87 mg/dL (ref <150)

## 2020-10-09 LAB — HEMOGLOBIN A1C
Hgb A1c MFr Bld: 5.7 % of total Hgb — ABNORMAL HIGH (ref <5.7)
Mean Plasma Glucose: 117 (calc)
eAG (mmol/L): 6.5 (calc)

## 2020-10-09 LAB — TEST AUTHORIZATION

## 2020-10-09 LAB — TSH: TSH: 1.83 mIU/L

## 2020-10-09 LAB — VARICELLA ZOSTER ANTIBODY, IGG: Varicella IgG: 709.9 index

## 2020-10-22 ENCOUNTER — Ambulatory Visit: Payer: Managed Care, Other (non HMO)

## 2020-10-22 IMAGING — DX DG KNEE COMPLETE 4+V*R*
4 series · 4 of 4 positions shown · non-contrast
Comparison: None.

CLINICAL DATA: Medial right knee pain for 3 weeks.

EXAM:
LEFT KNEE - 1-2 VIEW; RIGHT KNEE - COMPLETE 4+ VIEW

[knee lat]
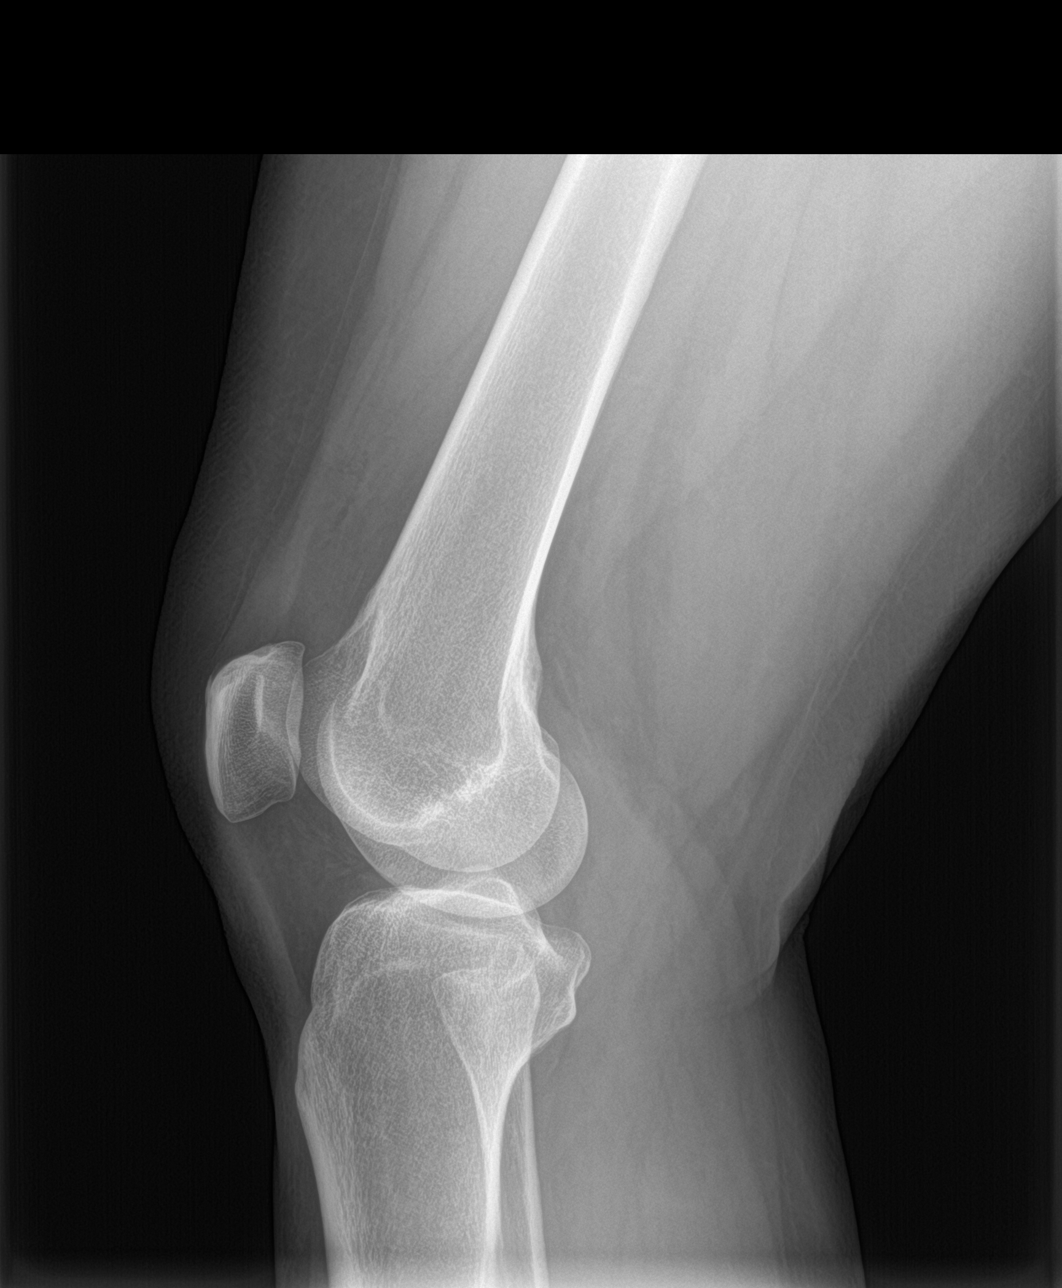

[knee sunrise]
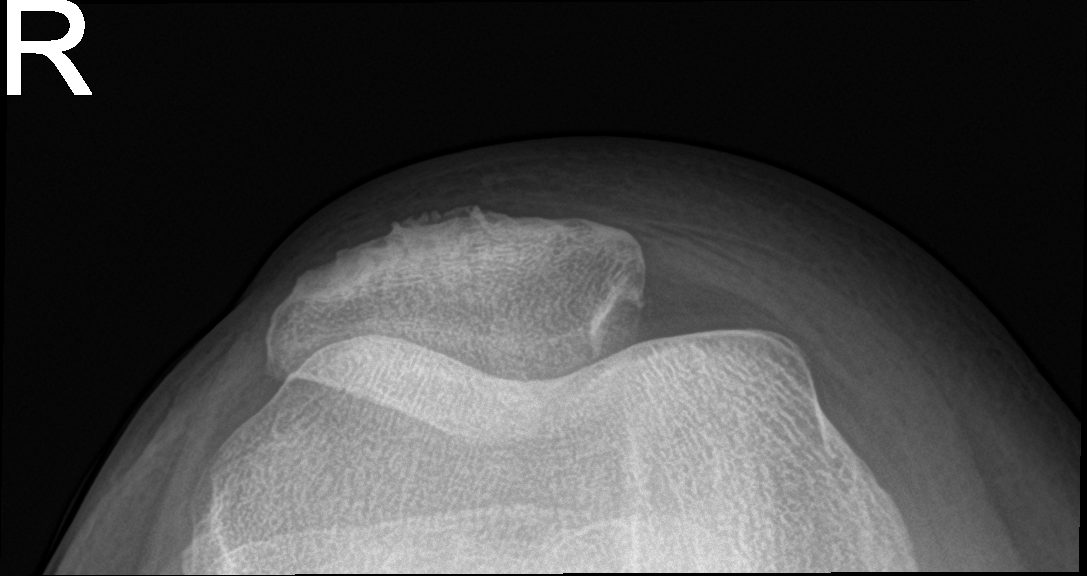

[knee ap bilat standing (1 of 2)]
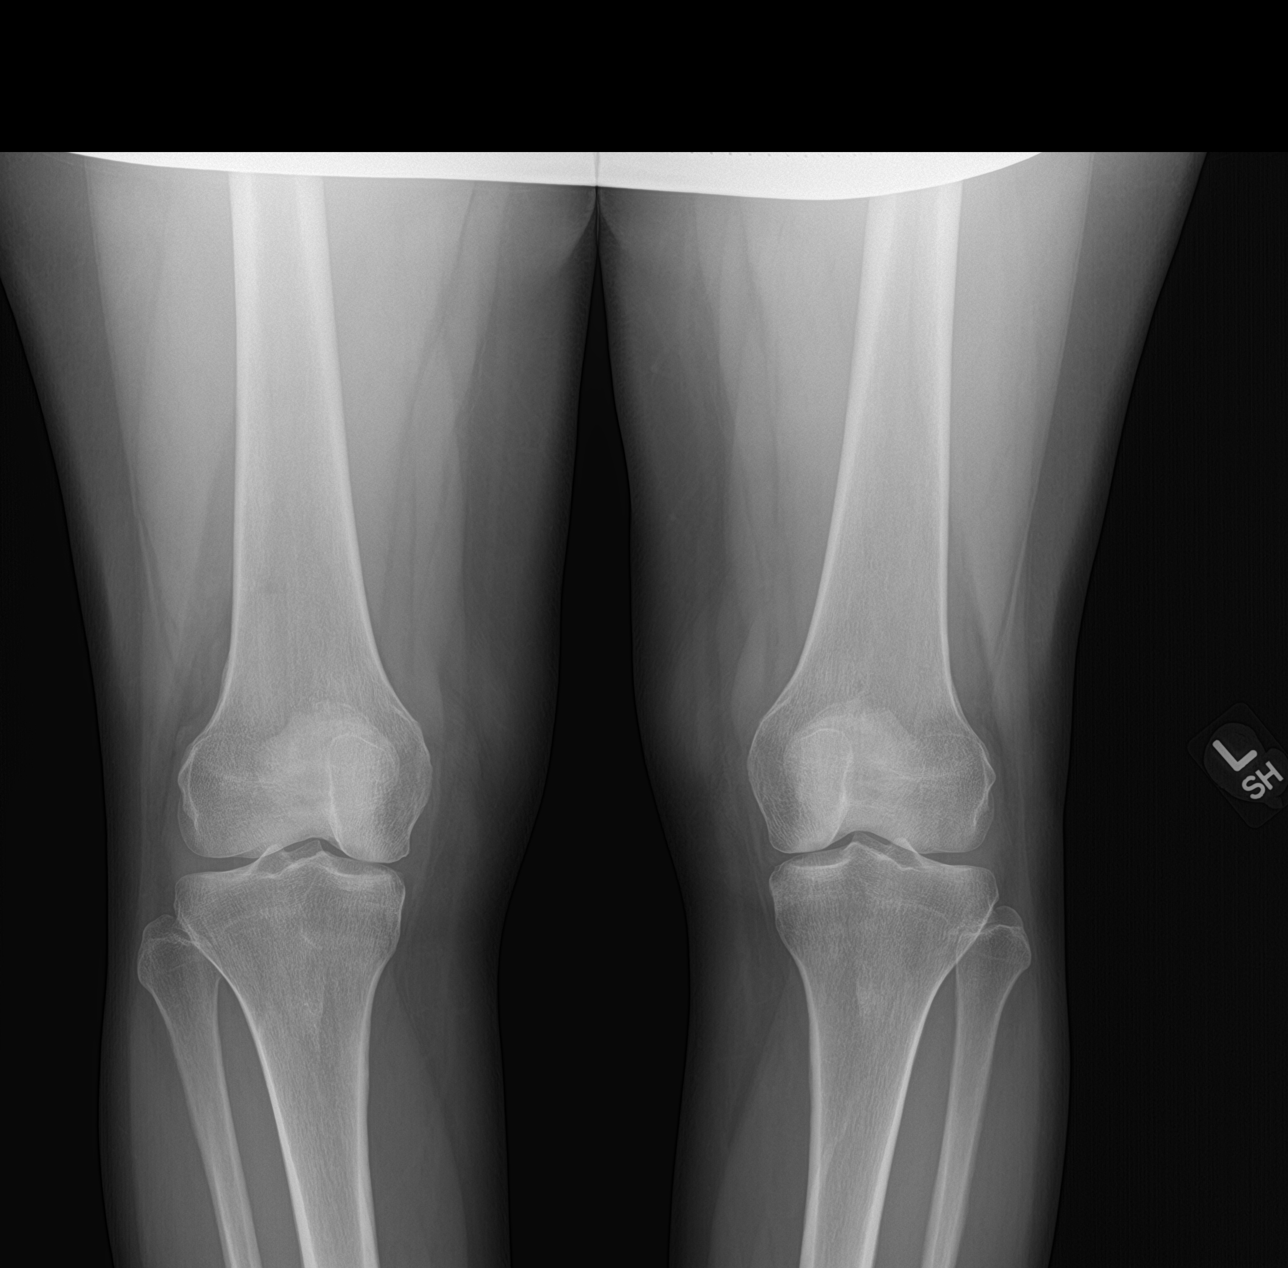

[knee ap bilat standing (2 of 2)]
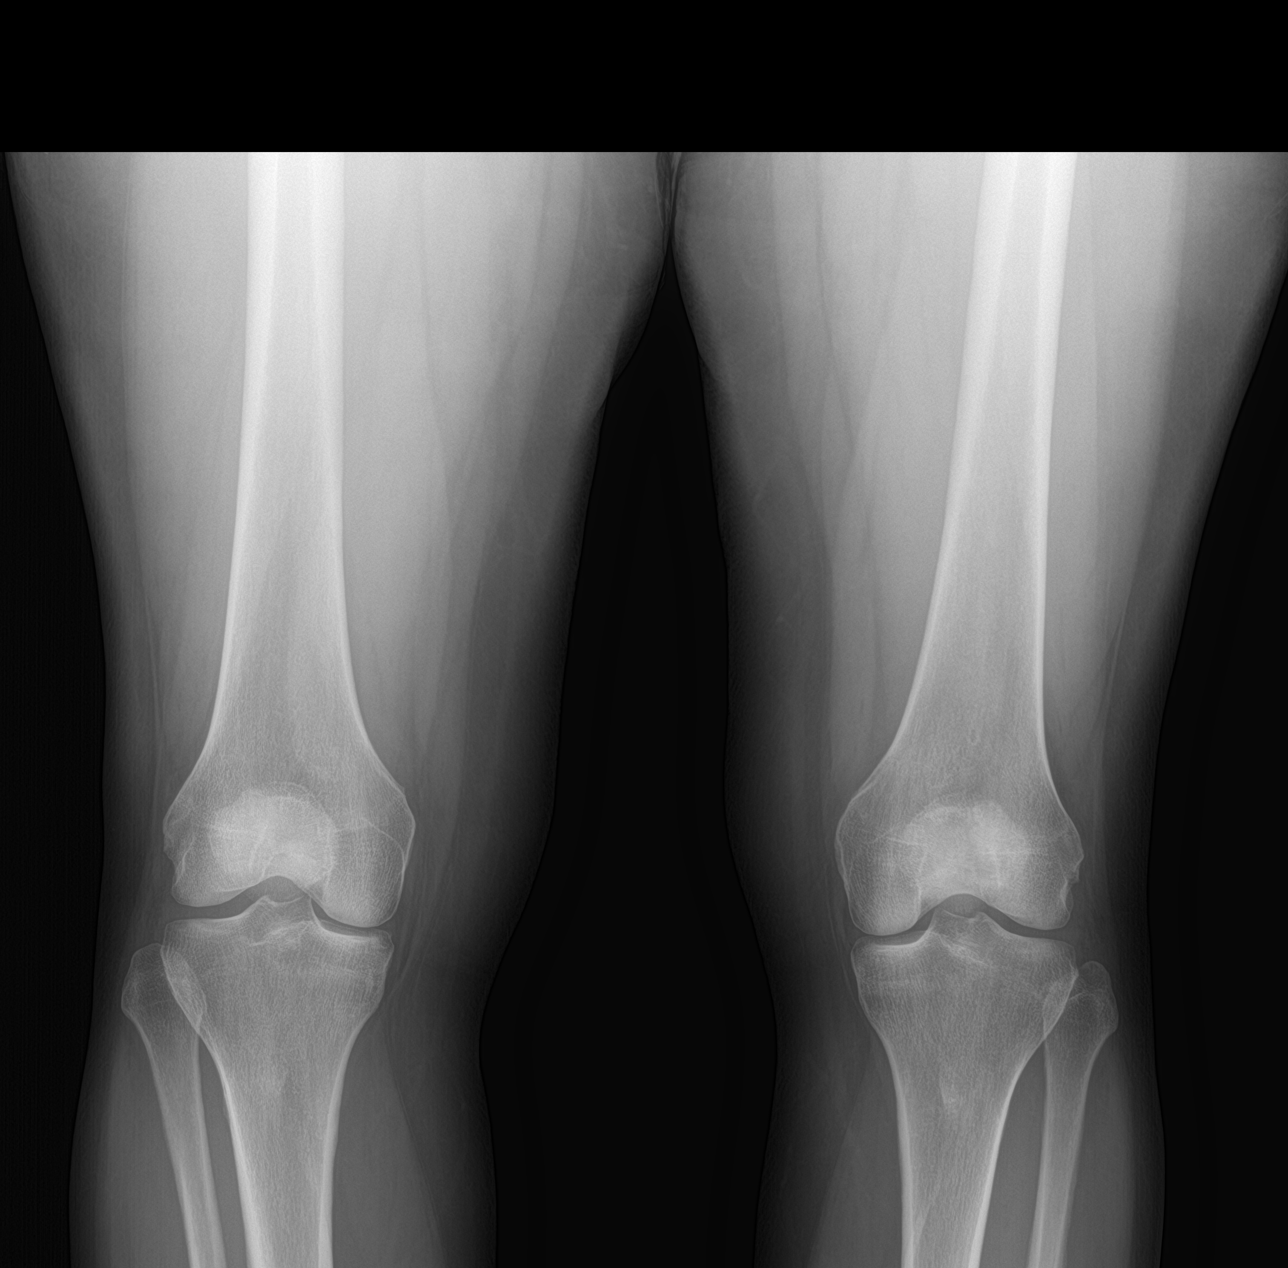

[4 of 4 positions shown; findings below may reference images not displayed]

FINDINGS: No evidence of fracture, dislocation, or joint effusion. There is a
small superior patellar enthesophyte. Soft tissues are unremarkable.
IMPRESSION: No acute findings. Small superior patellar enthesophyte.

## 2020-11-04 ENCOUNTER — Other Ambulatory Visit: Payer: Self-pay

## 2020-11-04 DIAGNOSIS — F909 Attention-deficit hyperactivity disorder, unspecified type: Secondary | ICD-10-CM

## 2020-11-07 MED ORDER — AMPHETAMINE-DEXTROAMPHET ER 20 MG PO CP24: 20.0000 mg | ORAL_CAPSULE | ORAL | 0 refills | Status: DC

## 2020-11-15 NOTE — Telephone Encounter (Signed)
Unable to make an orthopedic/neurologic diagnosis over an email, appointment needed.

## 2020-12-05 ENCOUNTER — Other Ambulatory Visit: Payer: Self-pay

## 2020-12-05 DIAGNOSIS — F909 Attention-deficit hyperactivity disorder, unspecified type: Secondary | ICD-10-CM

## 2020-12-05 MED ORDER — AMPHETAMINE-DEXTROAMPHET ER 20 MG PO CP24: 20.0000 mg | ORAL_CAPSULE | ORAL | 0 refills | Status: DC

## 2020-12-06 MED ORDER — AMPHETAMINE-DEXTROAMPHETAMINE 20 MG PO TABS: 20.0000 mg | ORAL_TABLET | Freq: Every day | ORAL | 0 refills | Status: DC

## 2020-12-24 LAB — HM MAMMOGRAPHY: HM Mammogram: NORMAL (ref 0–4)

## 2021-01-19 ENCOUNTER — Other Ambulatory Visit: Payer: Self-pay

## 2021-01-19 DIAGNOSIS — K649 Unspecified hemorrhoids: Secondary | ICD-10-CM

## 2021-01-21 DIAGNOSIS — K649 Unspecified hemorrhoids: Secondary | ICD-10-CM | POA: Insufficient documentation

## 2021-01-21 MED ORDER — AMPHETAMINE-DEXTROAMPHETAMINE 20 MG PO TABS: 20.0000 mg | ORAL_TABLET | Freq: Every day | ORAL | 0 refills | Status: DC

## 2021-01-23 ENCOUNTER — Encounter: Payer: Self-pay | Admitting: Gastroenterology

## 2021-02-04 ENCOUNTER — Other Ambulatory Visit: Payer: Self-pay

## 2021-02-04 MED ORDER — AMPHETAMINE-DEXTROAMPHETAMINE 20 MG PO TABS: 20.0000 mg | ORAL_TABLET | Freq: Every day | ORAL | 0 refills | Status: DC

## 2021-02-25 ENCOUNTER — Other Ambulatory Visit: Payer: Self-pay

## 2021-02-25 ENCOUNTER — Encounter: Payer: Self-pay | Admitting: Gastroenterology

## 2021-02-25 ENCOUNTER — Ambulatory Visit (INDEPENDENT_AMBULATORY_CARE_PROVIDER_SITE_OTHER): Payer: Managed Care, Other (non HMO) | Admitting: Gastroenterology

## 2021-02-25 VITALS — BP 122/78 | HR 88 | Ht 64.0 in | Wt 186.2 lb

## 2021-02-25 DIAGNOSIS — Z1211 Encounter for screening for malignant neoplasm of colon: Secondary | ICD-10-CM

## 2021-02-25 NOTE — Progress Notes (Signed)
Chief Complaint: For colonoscopy  Referring Provider:  Silverio Decamp,*      ASSESSMENT AND PLAN;   #1. Colorectal cancer screening  Plan: - Proceed with colonoscopy. Discussed risks & benefits. Risks including rare perforation req laparotomy, bleeding after bx/polypectomy req blood transfusion, rarely missing neoplasms, risks of anesthesia/sedation. Benefits outweigh the risks. Patient agrees to proceed. All the questions were answered. Consent forms given for review.   HPI:    April [name] is a 50 y.o. female  No nausea, vomiting, heartburn, regurgitation, odynophagia or dysphagia.  No significant diarrhea or constipation. No melena or hematochezia. No unintentional weight loss. No abdominal pain.  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 High cholesterol     Past Surgical History:  Procedure Laterality Date  . ABLATION    . CESAREAN SECTION    . CHOLECYSTECTOMY    . SPINAL FUSION     cervical spine C3-5  . TUBAL LIGATION      Family History  Problem Relation Age of Onset  . Cancer Maternal Aunt     Social History   Tobacco Use  . Smoking status: Former Smoker    Types: Cigarettes    Quit date: 06/23/2019    Years since quitting: 1.6  . Smokeless tobacco: Never Used  Vaping Use  . Vaping Use: Every day  . Substances: Nicotine  Substance Use Topics  . Alcohol use: Yes    Comment: 2-4 q wk  . Drug use: No    Current Outpatient Medications  Medication Sig Dispense Refill  . amphetamine-dextroamphetamine (ADDERALL) 20 MG tablet Take 1 tablet (20 mg total) by mouth daily. 30 tablet 0  . atorvastatin (LIPITOR) 10 MG tablet TAKE 1 TABLET (10 MG TOTAL) BY MOUTH DAILY AT 6 PM. 90 tablet 1  . citalopram (CELEXA) 20 MG tablet Take 1.5 tablets (30 mg total) by mouth daily. 135 tablet 3  . Melatonin 3 MG CAPS Take 1 capsule by mouth at bedtime.     No current facility-administered  medications for this visit.    Allergies  Allergen Reactions  . Lidocaine Other (See Comments)    LIDOCAINE IS NOT EFFECTIVE FOR HER.  Tetracaine does work.    Review of Systems:  Constitutional: Denies fever, chills, diaphoresis, appetite change and fatigue.  HEENT: Denies photophobia, eye pain, redness, hearing loss, ear pain, congestion, sore throat, rhinorrhea, sneezing, mouth sores, neck pain, neck stiffness and tinnitus.   Respiratory: Denies SOB, DOE, cough, chest tightness,  and wheezing.   Cardiovascular: Denies chest pain, palpitations and leg swelling.  Genitourinary: Denies dysuria, urgency, frequency, hematuria, flank pain and difficulty urinating.  Musculoskeletal: Denies myalgias, back pain, joint swelling, arthralgias and gait problem.  Skin: No rash.  Neurological: Denies dizziness, seizures, syncope, weakness, light-headedness, numbness and headaches.  Hematological: Denies adenopathy. Easy bruising, personal or family bleeding history  Psychiatric/Behavioral: No anxiety or depression     Physical Exam:    BP 122/78 (BP Location: Left Arm, Patient Position: Sitting, Cuff Size: Normal)   Pulse 88   Ht 5\' 4"  (1.626 m)   Wt 186 lb 4 oz (84.5 kg)   BMI 31.97 kg/m  Wt Readings from Last 3 Encounters:  02/25/21 186 lb 4 oz (84.5 kg)  10/08/20 183 lb (83 kg)  06/22/20 175 lb (79.4 kg)   Constitutional:  Well-developed, in no acute distress. Psychiatric: Normal mood and affect.  Behavior is normal. HEENT: Pupils normal.  Conjunctivae are normal. No scleral icterus. Neck supple.  Cardiovascular: Normal rate, regular rhythm. No edema Pulmonary/chest: Effort normal and breath sounds normal. No wheezing, rales or rhonchi. Abdominal: Soft, nondistended. Nontender. Bowel sounds active throughout. There are no masses palpable. No hepatomegaly. Rectal: Deferred Neurological: Alert and oriented to person place and time. Skin: Skin is warm and dry. No rashes noted.  Data  Reviewed: I have personally reviewed following labs and imaging studies  CBC: CBC Latest Ref Rng & Units 10/03/2020 06/22/2020 07/29/2019  WBC 3.8 - 10.8 Thousand/uL 5.0 7.0 7.3  Hemoglobin 11.7 - 15.5 g/dL 14.2 13.1 13.6  Hematocrit 35.0 - 45.0 % 43.5 40.1 41.3  Platelets 140 - 400 Thousand/uL 238 149(L) 223    CMP: CMP Latest Ref Rng & Units 10/03/2020 06/22/2020 07/29/2019  Glucose 65 - 99 mg/dL 103(H) 110(H) 107(H)  BUN 7 - 25 mg/dL 17 10 13   Creatinine 0.50 - 1.05 mg/dL 0.78 0.82 0.82  Sodium 135 - 146 mmol/L 142 138 143  Potassium 3.5 - 5.3 mmol/L 4.7 4.3 4.8  Chloride 98 - 110 mmol/L 103 100 104  CO2 20 - 32 mmol/L 32 24 28  Calcium 8.6 - 10.4 mg/dL 9.9 9.3 9.8  Total Protein 6.1 - 8.1 g/dL 7.0 - 6.9  Total Bilirubin 0.2 - 1.2 mg/dL 0.5 - 0.4  Alkaline Phos 33 - 115 U/L - - -  AST 10 - 35 U/L 16 - 15  ALT 6 - 29 U/L 15 - 17      Carmell Austria, MD 02/25/2021, 10:39 AM  Cc: Silverio Decamp,*

## 2021-02-25 NOTE — Patient Instructions (Signed)
If you are age 52 or older, your body mass index should be between 23-30. Your Body mass index is 31.97 kg/m. If this is out of the aforementioned range listed, please consider follow up with your Primary Care Provider.  If you are age 57 or younger, your body mass index should be between 19-25. Your Body mass index is 31.97 kg/m. If this is out of the aformentioned range listed, please consider follow up with your Primary Care Provider.   You have been scheduled for a colonoscopy. Please follow written instructions given to you at your visit today.  Please pick up your prep supplies at the pharmacy within the next 1-3 days. If you use inhalers (even only as needed), please bring them with you on the day of your procedure.  Thank you,  Dr. Jackquline Denmark

## 2021-03-08 ENCOUNTER — Other Ambulatory Visit: Payer: Self-pay

## 2021-03-08 MED ORDER — AMPHETAMINE-DEXTROAMPHETAMINE 20 MG PO TABS: 20.0000 mg | ORAL_TABLET | Freq: Every day | ORAL | 0 refills | Status: DC

## 2021-03-08 NOTE — Telephone Encounter (Signed)
Last written 02/04/2021 #30 no refills

## 2021-03-18 ENCOUNTER — Encounter: Payer: Managed Care, Other (non HMO) | Admitting: Gastroenterology

## 2021-04-07 ENCOUNTER — Other Ambulatory Visit: Payer: Self-pay

## 2021-04-09 MED ORDER — AMPHETAMINE-DEXTROAMPHETAMINE 20 MG PO TABS: 20.0000 mg | ORAL_TABLET | Freq: Every day | ORAL | 0 refills | Status: DC

## 2021-04-18 ENCOUNTER — Other Ambulatory Visit: Payer: Self-pay | Admitting: Sports Medicine

## 2021-04-18 DIAGNOSIS — F411 Generalized anxiety disorder: Secondary | ICD-10-CM

## 2021-05-01 ENCOUNTER — Encounter: Payer: Self-pay | Admitting: Gastroenterology

## 2021-05-07 ENCOUNTER — Other Ambulatory Visit: Payer: Self-pay

## 2021-05-07 MED ORDER — AMPHETAMINE-DEXTROAMPHETAMINE 20 MG PO TABS: 20.0000 mg | ORAL_TABLET | Freq: Every day | ORAL | 0 refills | Status: DC

## 2021-05-13 ENCOUNTER — Encounter: Payer: Managed Care, Other (non HMO) | Admitting: Gastroenterology

## 2021-06-05 ENCOUNTER — Other Ambulatory Visit: Payer: Self-pay

## 2021-06-06 MED ORDER — AMPHETAMINE-DEXTROAMPHETAMINE 20 MG PO TABS: 20.0000 mg | ORAL_TABLET | Freq: Every day | ORAL | 0 refills | Status: DC

## 2021-06-24 ENCOUNTER — Ambulatory Visit: Payer: Self-pay | Admitting: Family Medicine

## 2021-07-02 ENCOUNTER — Encounter: Payer: Self-pay | Admitting: Physician Assistant

## 2021-07-02 ENCOUNTER — Telehealth: Payer: Self-pay | Admitting: Physician Assistant

## 2021-07-02 DIAGNOSIS — R300 Dysuria: Secondary | ICD-10-CM

## 2021-07-02 MED ORDER — CEPHALEXIN 500 MG PO CAPS: 500.0000 mg | ORAL_CAPSULE | Freq: Two times a day (BID) | ORAL | 0 refills | Status: AC

## 2021-07-02 NOTE — Addendum Note (Signed)
Addended by: Brunetta Jeans on: 07/02/2021 04:14 PM   Modules accepted: Orders

## 2021-07-02 NOTE — Patient Instructions (Signed)
[name], thank you for joining [name], PA-C for today's virtual visit.  While this provider is not your primary care provider (PCP), if your PCP is located in our provider database this encounter information will be shared with them immediately following your visit.  Consent: (Patient) April [name] provided verbal consent for this virtual visit at the beginning of the encounter.  Current Medications:  Current Outpatient Medications:    amphetamine-dextroamphetamine (ADDERALL) 20 MG tablet, Take 1 tablet (20 mg total) by mouth daily., Disp: 30 tablet, Rfl: 0   gabapentin (NEURONTIN) 300 MG capsule, Take I capsule at bedtime, Disp: , Rfl:    atorvastatin (LIPITOR) 10 MG tablet, TAKE 1 TABLET (10 MG TOTAL) BY MOUTH DAILY AT 6 PM., Disp: 90 tablet, Rfl: 1   citalopram (CELEXA) 20 MG tablet, TAKE 1 AND 1/2 TABLETS DAILY BY MOUTH, Disp: 135 tablet, Rfl: 3   Melatonin 3 MG CAPS, Take 1 capsule by mouth at bedtime., Disp: , Rfl:    Medications ordered in this encounter:  No orders of the defined types were placed in this encounter.    *If you need refills on other medications prior to your next appointment, please contact your pharmacy*  Follow-Up: Call back or seek an in-person evaluation if the symptoms worsen or if the condition fails to improve as anticipated.  Other Instructions Your symptoms are consistent with a bladder infection, also called acute cystitis. Please take your antibiotic (Keflex) as directed until all pills are gone.  Stay very well hydrated.  Consider a daily probiotic (Align, Culturelle, or Activia) to help prevent stomach upset caused by the antibiotic.  Taking a probiotic daily may also help prevent recurrent UTIs.  Also consider taking AZO (Phenazopyridine) tablets to help decrease pain with urination.     Urinary Tract Infection A urinary tract infection (UTI) can occur any place along the urinary tract. The tract includes the kidneys, ureters,  bladder, and urethra. A type of germ called bacteria often causes a UTI. UTIs are often helped with antibiotic medicine.  HOME CARE  If given, take antibiotics as told by your doctor. Finish them even if you start to feel better. Drink enough fluids to keep your pee (urine) clear or pale yellow. Avoid tea, drinks with caffeine, and bubbly (carbonated) drinks. Pee often. Avoid holding your pee in for a long time. Pee before and after having sex (intercourse). Wipe from front to back after you poop (bowel movement) if you are a woman. Use each tissue only once. GET HELP RIGHT AWAY IF:  You have back pain. You have lower belly (abdominal) pain. You have chills. You feel sick to your stomach (nauseous). You throw up (vomit). Your burning or discomfort with peeing does not go away. You have a fever. Your symptoms are not better in 3 days. MAKE SURE YOU:  Understand these instructions. Will watch your condition. Will get help right away if you are not doing well or get worse. Document Released: 04/21/2008 Document Revised: 07/28/2012 Document Reviewed: 06/03/2012 [facility] Patient Information 2015 [place], [place]. This information is not intended to replace advice given to you by your health care provider. Make sure you discuss any questions you have with your health care provider.    If you have been instructed to have an in-person evaluation today at a local Urgent Care facility, please use the link below. It will take you to a list of all of our available [facility], including address, phone number and hours  of operation. Please do not delay care.  [facility]  If you or a family member do not have a primary care provider, use the link below to schedule a visit and establish care. When you choose a [facility] primary care physician or advanced practice provider, you gain a long-term partner in health. Find a Primary Care Provider  Learn more about [facility]'s  in-office and virtual care options: [facility]

## 2021-07-02 NOTE — Progress Notes (Signed)
Virtual Visit Consent   Rusty Manser, you are scheduled for a virtual visit with a [facility] provider today.     Just as with appointments in the office, your consent must be obtained to participate.  Your consent will be active for this visit and any virtual visit you may have with one of our providers in the next 365 days.     If you have a MyChart account, a copy of this consent can be sent to you electronically.  All virtual visits are billed to your insurance company just like a traditional visit in the office.    As this is a virtual visit, video technology does not allow for your provider to perform a traditional examination.  This may limit your provider's ability to fully assess your condition.  If your provider identifies any concerns that need to be evaluated in person or the need to arrange testing (such as labs, EKG, etc.), we will make arrangements to do so.     Although advances in technology are sophisticated, we cannot ensure that it will always work on either your end or our end.  If the connection with a video visit is poor, the visit may have to be switched to a telephone visit.  With either a video or telephone visit, we are not always able to ensure that we have a secure connection.     I need to obtain your verbal consent now.   Are you willing to proceed with your visit today?    [name] has provided verbal consent on 07/02/2021 for a virtual visit (video or telephone).   [name], [place]   Date: 07/02/2021 11:04 AM   Virtual Visit via Video Note   I, [name], connected with  April [name]  ([id number], [birth year]) on 07/02/21 at 11:00 AM EDT by a video-enabled telemedicine application and verified that I am speaking with the correct person using two identifiers.  Location: Patient: Virtual Visit Location Patient: Home Provider: Virtual Visit Location Provider: Home Office   I discussed the limitations of evaluation and management by  telemedicine and the availability of in person appointments. The patient expressed understanding and agreed to proceed.    History of Present Illness: April [name] is a 50 y.o. who identifies as a female who was assigned female at birth, and is being seen today for urinary symptoms. Patient endorses yesterday noting with dysuria, urgency, frequency and hesitancy. Denies hematuria, flank pain.  Notes she has not been hydrating as well and holding her bladder at work. Is getting over a recent COVID infection.   HPI: HPI  Problems:  Patient Active Problem List   Diagnosis Date Noted   Hemorrhoids 01/21/2021   Seborrheic keratosis right side of the nose near the medial canthus 10/08/2020   Lumbar degenerative disc disease 12/26/2019   Internal derangement of right knee 08/24/2019   Onychomycosis 08/01/2019   GERD (gastroesophageal reflux disease) 03/15/2019   Essential hypertension 12/20/2018   Bilateral carpal tunnel syndrome 08/02/2018   Hyperlipidemia LDL goal <100 07/14/2017   Current smoker 02/12/2016   Adult ADHD 11/15/2015   Rosacea 11/15/2015   Obesity 06/02/2014   Cervical radiculitis 05/05/2014   Actinic keratosis of left cheek 03/17/2013   Generalized anxiety disorder 03/15/2013   Insomnia 03/15/2013   Annual physical exam 03/15/2013    Allergies:  Allergies  Allergen Reactions   Lidocaine Other (See Comments)    LIDOCAINE IS NOT EFFECTIVE FOR HER.  Tetracaine does work.  Medications:  Current Outpatient Medications:    amphetamine-dextroamphetamine (ADDERALL) 20 MG tablet, Take 1 tablet (20 mg total) by mouth daily., Disp: 30 tablet, Rfl: 0   gabapentin (NEURONTIN) 300 MG capsule, Take I capsule at bedtime, Disp: , Rfl:    atorvastatin (LIPITOR) 10 MG tablet, TAKE 1 TABLET (10 MG TOTAL) BY MOUTH DAILY AT 6 PM., Disp: 90 tablet, Rfl: 1   citalopram (CELEXA) 20 MG tablet, TAKE 1 AND 1/2 TABLETS DAILY BY MOUTH, Disp: 135 tablet, Rfl: 3   Melatonin 3 MG CAPS, Take 1  capsule by mouth at bedtime., Disp: , Rfl:   Observations/Objective: Patient is well-developed, well-nourished in no acute distress.  Resting comfortably at home.  Head is normocephalic, atraumatic.  No labored breathing. Speech is clear and coherent with logical content.  Patient is alert and oriented at baseline.   Assessment and Plan: 1. Dysuria Milder symptoms. No alarm signs/symptoms so reasonable to treat for uncomplicated UTI with Keflex 500 mg BID x 7 days. Supportive measures and OTC medications reviewed. If not resolving, anything worsens or new symptoms develop, she will need in-office evaluation.   Follow Up Instructions: I discussed the assessment and treatment plan with the patient. The patient was provided an opportunity to ask questions and all were answered. The patient agreed with the plan and demonstrated an understanding of the instructions.  A copy of instructions were sent to the patient via MyChart.  The patient was advised to call back or seek an in-person evaluation if the symptoms worsen or if the condition fails to improve as anticipated.  Time:  I spent 12 minutes with the patient via telehealth technology discussing the above problems/concerns.    [name], PA-C

## 2021-07-04 ENCOUNTER — Other Ambulatory Visit: Payer: Self-pay

## 2021-07-05 ENCOUNTER — Other Ambulatory Visit: Payer: Self-pay

## 2021-07-05 MED ORDER — AMPHETAMINE-DEXTROAMPHETAMINE 20 MG PO TABS: 20.0000 mg | ORAL_TABLET | Freq: Every day | ORAL | 0 refills | Status: DC

## 2021-07-05 NOTE — Telephone Encounter (Signed)
Patient reports that she is OUT.   Last OV: 10/08/20 Last fill: 06/06/2021

## 2021-07-05 NOTE — Telephone Encounter (Signed)
Patient aware prescription sent to pharmacy. 

## 2021-07-29 ENCOUNTER — Encounter: Payer: Self-pay | Admitting: Gastroenterology

## 2021-07-29 ENCOUNTER — Other Ambulatory Visit: Payer: Self-pay

## 2021-07-29 ENCOUNTER — Ambulatory Visit (AMBULATORY_SURGERY_CENTER): Payer: Managed Care, Other (non HMO) | Admitting: *Deleted

## 2021-07-29 VITALS — Ht 64.0 in | Wt 182.0 lb

## 2021-07-29 DIAGNOSIS — M5136 Other intervertebral disc degeneration, lumbar region: Secondary | ICD-10-CM

## 2021-07-29 DIAGNOSIS — M51369 Other intervertebral disc degeneration, lumbar region without mention of lumbar back pain or lower extremity pain: Secondary | ICD-10-CM

## 2021-07-29 DIAGNOSIS — Z1211 Encounter for screening for malignant neoplasm of colon: Secondary | ICD-10-CM

## 2021-07-29 MED ORDER — CLENPIQ 10-3.5-12 MG-GM -GM/160ML PO SOLN: 1.0000 | ORAL | 0 refills | Status: DC

## 2021-07-29 NOTE — Progress Notes (Signed)
Pt verified name, DOB, address and insurance during PV today.  Pt mailed instruction packet of Emmi video, copy of consent form to read and not return, and instructions. Clenpiq coupon mailed in packet. PV completed over the phone.  Pt encouraged to call with questions or issues.  My Chart instructions to pt as well    No egg or soy allergy known to patient  No issues known to pt with past sedation with any surgeries or procedures Patient denies ever being told they had issues or difficulty with intubation  No FH of Malignant Hyperthermia Pt is not on diet pills Pt is not on  home 02  Pt is not on blood thinners  Pt denies issues with constipation  No A fib or A flutter  EMMI video to pt or via MyChart    Pt is not vaccinated  for [employer] given to pt in PV today , Code to Pharmacy and  NO PA's for preps discussed with pt In PV today  Discussed with pt there will be an out-of-pocket cost for prep and that varies from $0 to 70 +  dollars   Due to the COVID-19 pandemic we are asking patients to follow certain guidelines.  Pt aware of COVID protocols and [facility] guidelines

## 2021-07-30 MED ORDER — GABAPENTIN 300 MG PO CAPS: 300.0000 mg | ORAL_CAPSULE | Freq: Every day | ORAL | 3 refills | Status: DC

## 2021-08-05 ENCOUNTER — Other Ambulatory Visit: Payer: Self-pay

## 2021-08-05 MED ORDER — AMPHETAMINE-DEXTROAMPHETAMINE 20 MG PO TABS: 20.0000 mg | ORAL_TABLET | Freq: Every day | ORAL | 0 refills | Status: DC

## 2021-08-12 ENCOUNTER — Telehealth: Payer: Self-pay | Admitting: Gastroenterology

## 2021-08-12 NOTE — Telephone Encounter (Signed)
Inbound call from patient. Calling in to speak with nurse about procedure 9/27

## 2021-08-12 NOTE — Telephone Encounter (Signed)
Patient called with questions for the nurse. Called patient back and she told me that she forgot and had eaten a salad with seeds and nuts on it this past Friday. I told her that she was still fine to proceed with tomorrow's procedure but to increase her fluid intake today and try to flush her bowels. She agreed and had no further questions.

## 2021-08-13 ENCOUNTER — Encounter: Payer: Self-pay | Admitting: Gastroenterology

## 2021-08-13 ENCOUNTER — Other Ambulatory Visit: Payer: Self-pay

## 2021-08-13 ENCOUNTER — Ambulatory Visit (AMBULATORY_SURGERY_CENTER): Payer: Managed Care, Other (non HMO) | Admitting: Gastroenterology

## 2021-08-13 VITALS — BP 130/88 | HR 71 | Temp 98.7°F | Resp 17 | Ht 64.0 in | Wt 183.0 lb

## 2021-08-13 DIAGNOSIS — D127 Benign neoplasm of rectosigmoid junction: Secondary | ICD-10-CM | POA: Diagnosis not present

## 2021-08-13 DIAGNOSIS — Z1211 Encounter for screening for malignant neoplasm of colon: Secondary | ICD-10-CM | POA: Diagnosis not present

## 2021-08-13 MED ORDER — SODIUM CHLORIDE 0.9 % IV SOLN: 500.0000 mL | Freq: Once | INTRAVENOUS | Status: DC

## 2021-08-13 NOTE — Progress Notes (Signed)
Pt's states no medical or surgical changes since previsit or office visit. 

## 2021-08-13 NOTE — Patient Instructions (Signed)
Handouts given for diverticulosis and polyps.  YOU HAD AN ENDOSCOPIC PROCEDURE TODAY AT [facility]:   Refer to the procedure report that was given to you for any specific questions about what was found during the examination.  If the procedure report does not answer your questions, please call your gastroenterologist to clarify.  If you requested that your care partner not be given the details of your procedure findings, then the procedure report has been included in a sealed envelope for you to review at your convenience later.  YOU SHOULD EXPECT: Some feelings of bloating in the abdomen. Passage of more gas than usual.  Walking can help get rid of the air that was put into your GI tract during the procedure and reduce the bloating. If you had a lower endoscopy (such as a colonoscopy or flexible sigmoidoscopy) you may notice spotting of blood in your stool or on the toilet paper. If you underwent a bowel prep for your procedure, you may not have a normal bowel movement for a few days.  Please Note:  You might notice some irritation and congestion in your nose or some drainage.  This is from the oxygen used during your procedure.  There is no need for concern and it should clear up in a day or so.  SYMPTOMS TO REPORT IMMEDIATELY:  Following lower endoscopy (colonoscopy or flexible sigmoidoscopy):  Excessive amounts of blood in the stool  Significant tenderness or worsening of abdominal pains  Swelling of the abdomen that is new, acute  Fever of 100F or higher  For urgent or emergent issues, a gastroenterologist can be reached at any hour by calling [phone]. Do not use MyChart messaging for urgent concerns.    DIET:  We do recommend a small meal at first, but then you may proceed to your regular diet.  Drink plenty of fluids but you should avoid alcoholic beverages for 24 hours.  ACTIVITY:  You should plan to take it easy for the rest of today and you should NOT DRIVE  or use heavy machinery until tomorrow (because of the sedation medicines used during the test).    FOLLOW UP: Our staff will call the number listed on your records 48-72 hours following your procedure to check on you and address any questions or concerns that you may have regarding the information given to you following your procedure. If we do not reach you, we will leave a message.  We will attempt to reach you two times.  During this call, we will ask if you have developed any symptoms of COVID 19. If you develop any symptoms (ie: fever, flu-like symptoms, shortness of breath, cough etc.) before then, please call [phone].  If you test positive for Covid 19 in the 2 weeks post procedure, please call and report this information to [place].    If any biopsies were taken you will be contacted by phone or by letter within the next 1-3 weeks.  Please call us at [phone] if you have not heard about the biopsies in 3 weeks.    SIGNATURES/CONFIDENTIALITY: You and/or your care partner have signed paperwork which will be entered into your electronic medical record.  These signatures attest to the fact that that the information above on your After Visit Summary has been reviewed and is understood.  Full responsibility of the confidentiality of this discharge information lies with you and/or your care-partner.

## 2021-08-13 NOTE — Progress Notes (Signed)
[facility] History and Physical   Primary Care Physician:  Silverio Decamp, MD   Reason for Procedure:   Colorectal cancer screening  Plan:     colonoscopy     HPI: April [name] is a 50 y.o. female  No nausea, vomiting, heartburn, regurgitation, odynophagia or dysphagia.  No significant diarrhea or constipation.  No melena or hematochezia. No unintentional weight loss. No abdominal pain.   Past Medical History:  Diagnosis Date   Anxiety    DDD (degenerative disc disease), cervical    Hemorrhoids    High cholesterol    Pre-diabetes     Past Surgical History:  Procedure Laterality Date   ABLATION     CESAREAN SECTION     x2   CHOLECYSTECTOMY     HEMORRHOID SURGERY     x 5- thrombosed hemorroids   SPINAL FUSION     cervical spine C3-5   TUBAL LIGATION      Prior to Admission medications   Medication Sig Start Date End Date Taking? Authorizing Provider  amphetamine-dextroamphetamine (ADDERALL) 20 MG tablet Take 1 tablet (20 mg total) by mouth daily. 08/05/21  Yes Silverio Decamp, MD  atorvastatin (LIPITOR) 10 MG tablet TAKE 1 TABLET (10 MG TOTAL) BY MOUTH DAILY AT 6 PM. Patient taking differently: Take 5 mg by mouth daily. 10/08/20  Yes Silverio Decamp, MD  citalopram (CELEXA) 20 MG tablet TAKE 1 AND 1/2 TABLETS DAILY BY MOUTH 04/18/21  Yes Silverio Decamp, MD  gabapentin (NEURONTIN) 300 MG capsule Take 1 capsule (300 mg total) by mouth at bedtime. 07/30/21  Yes Silverio Decamp, MD  Melatonin 3 MG CAPS Take 1 capsule by mouth at bedtime.   Yes [provider]  Misc Natural Products (CALMME PO) Take by mouth. Q night   Yes [provider]    Current Outpatient Medications  Medication Sig Dispense Refill   amphetamine-dextroamphetamine (ADDERALL) 20 MG tablet Take 1 tablet (20 mg total) by mouth daily. 30 tablet 0   atorvastatin (LIPITOR) 10 MG tablet TAKE 1 TABLET (10 MG TOTAL) BY MOUTH DAILY AT 6 PM.  (Patient taking differently: Take 5 mg by mouth daily.) 90 tablet 1   citalopram (CELEXA) 20 MG tablet TAKE 1 AND 1/2 TABLETS DAILY BY MOUTH 135 tablet 3   gabapentin (NEURONTIN) 300 MG capsule Take 1 capsule (300 mg total) by mouth at bedtime. 90 capsule 3   Melatonin 3 MG CAPS Take 1 capsule by mouth at bedtime.     Misc Natural Products (CALMME PO) Take by mouth. Q night     Current Facility-Administered Medications  Medication Dose Route Frequency Provider Last Rate Last Admin   0.9 %  sodium chloride infusion  500 mL Intravenous Once Jackquline Denmark, MD        Allergies as of 08/13/2021 - Review Complete 08/13/2021  Allergen Reaction Noted   Lidocaine Other (See Comments) 04/11/2020    Family History  Problem Relation Age of Onset   Cancer Maternal Aunt    Colon cancer Neg Hx    Colon polyps Neg Hx    Esophageal cancer Neg Hx    Rectal cancer Neg Hx    Stomach cancer Neg Hx     Social History   Socioeconomic History   Marital status: Married    Spouse name: Not on file   Number of children: Not on file   Years of education: Not on file   Highest education level: Not on file  Occupational History   Not on file  Tobacco Use   Smoking status: Former    Types: Cigarettes    Quit date: 06/23/2019    Years since quitting: 2.1   Smokeless tobacco: Never  Vaping Use   Vaping Use: Every day   Substances: Nicotine  Substance and Sexual Activity   Alcohol use: Yes    Comment: 2-4 q wk   Drug use: No   Sexual activity: Yes  Other Topics Concern   Not on file  Social History Narrative   Not on file   Social Determinants of Health   Financial Resource Strain: Not on file  Food Insecurity: Not on file  Transportation Needs: Not on file  Physical Activity: Not on file  Stress: Not on file  Social Connections: Not on file  Intimate Partner Violence: Not on file    Review of Systems: Positive for none All other review of systems negative except as mentioned in the  HPI.  Physical Exam: Vital signs in last 24 hours: @VSRANGES @   General:   Alert,  Well-developed, well-nourished, pleasant and cooperative in NAD Lungs:  Clear throughout to auscultation.   Heart:  Regular rate and rhythm; no murmurs, clicks, rubs,  or gallops. Abdomen:  Soft, nontender and nondistended. Normal bowel sounds.   Neuro/Psych:  Alert and cooperative. Normal mood and affect. A and O x 3    No significant changes were identified.  The patient continues to be an appropriate candidate for the planned procedure and anesthesia.   Carmell Austria, MD. [facility] 08/13/2021 10:34 AM@

## 2021-08-13 NOTE — Op Note (Signed)
[facility] Patient Name: April [name] Procedure Date: 08/13/2021 10:29 AM MRN: [id number] Endoscopist: Jackquline Denmark , MD Age: 50 Referring MD:  Date of Birth: [birth year] Gender: Female Account #: [id number] Procedure:                Colonoscopy Indications:              Screening for colorectal malignant neoplasm Medicines:                Monitored Anesthesia Care Procedure:                Pre-Anesthesia Assessment:                           - Prior to the procedure, a History and Physical                            was performed, and patient medications and                            allergies were reviewed. The patient's tolerance of                            previous anesthesia was also reviewed. The risks                            and benefits of the procedure and the sedation                            options and risks were discussed with the patient.                            All questions were answered, and informed consent                            was obtained. Prior Anticoagulants: The patient has                            taken no previous anticoagulant or antiplatelet                            agents. ASA Grade Assessment: I - A normal, healthy                            patient. After reviewing the risks and benefits,                            the patient was deemed in satisfactory condition to                            undergo the procedure.                           After obtaining informed consent, the colonoscope  was passed under direct vision. Throughout the                            procedure, the patient's blood pressure, pulse, and                            oxygen saturations were monitored continuously. The                            Olympus CF-[id number]) Colonoscope was                            introduced through the anus and advanced to the 2                            cm into the ileum. The colonoscopy  was performed                            without difficulty. The patient tolerated the                            procedure well. The quality of the bowel                            preparation was good. The terminal ileum, ileocecal                            valve, appendiceal orifice, and rectum were                            photographed. Scope In: 11:25:50 AM Scope Out: 11:38:31 AM Scope Withdrawal Time: 0 hours 8 minutes 3 seconds  Total Procedure Duration: 0 hours 12 minutes 41 seconds  Findings:                 A 10 mm polyp was found in the recto-sigmoid colon.                            The polyp was semi-pedunculated. The polyp was                            removed with a hot snare. Resection and retrieval                            were complete.                           A few small-mouthed diverticula were found in the                            sigmoid colon.                           Non-bleeding internal hemorrhoids were found during  retroflexion and during perianal exam. The                            hemorrhoids were small and Grade II (internal                            hemorrhoids that prolapse but reduce spontaneously).                           The terminal ileum appeared normal.                           The exam was otherwise without abnormality on                            direct and retroflexion views. Complications:            No immediate complications. Estimated Blood Loss:     Estimated blood loss: none. Impression:               - One 10 mm polyp at the recto-sigmoid colon,                            removed with a hot snare. Resected and retrieved.                           - Minimal sigmoid diverticulosis.                           - Non-bleeding internal hemorrhoids.                           - The examined portion of the ileum was normal.                           - The examination was otherwise normal on direct                             and retroflexion views. Recommendation:           - Patient has a contact number available for                            emergencies. The signs and symptoms of potential                            delayed complications were discussed with the                            patient. Return to normal activities tomorrow.                            Written discharge instructions were provided to the                            patient.                           -  Resume previous diet.                           - Continue present medications.                           - Await pathology results.                           - Repeat colonoscopy for surveillance based on                            pathology results.                           - The findings and recommendations were discussed                            with the patient's family. Jackquline Denmark, MD 08/13/2021 11:44:11 AM This report has been signed electronically.

## 2021-08-13 NOTE — Progress Notes (Signed)
No CO2 trap for monitor in room 4. Pt moved prior to sedation to room 2.

## 2021-08-13 NOTE — Progress Notes (Signed)
PT taken to PACU. Monitors in place. VSS. Report given to RN. 

## 2021-08-13 NOTE — Progress Notes (Signed)
Called to room to assist during endoscopic procedure.  Patient ID and intended procedure confirmed with present staff. Received instructions for my participation in the procedure from the performing physician.  

## 2021-08-15 ENCOUNTER — Telehealth: Payer: Self-pay

## 2021-08-15 NOTE — Telephone Encounter (Signed)
  Follow up Call-  Call back number 08/13/2021  Post procedure Call Back phone  # [phone]  Permission to leave phone message Yes  Some recent data might be hidden     Patient questions:  Do you have a fever, pain , or abdominal swelling? No. Pain Score  0 *  Have you tolerated food without any problems? Yes.    Have you been able to return to your normal activities? Yes.    Do you have any questions about your discharge instructions: Diet   No. Medications  No. Follow up visit  No.  Do you have questions or concerns about your Care? No.  Actions: * If pain score is 4 or above: No action needed, pain <4.

## 2021-08-19 ENCOUNTER — Encounter: Payer: Self-pay | Admitting: Gastroenterology

## 2021-09-06 ENCOUNTER — Other Ambulatory Visit: Payer: Self-pay

## 2021-09-06 MED ORDER — AMPHETAMINE-DEXTROAMPHETAMINE 20 MG PO TABS: 20.0000 mg | ORAL_TABLET | Freq: Every day | ORAL | 0 refills | Status: DC

## 2021-10-06 ENCOUNTER — Other Ambulatory Visit: Payer: Self-pay | Admitting: Sports Medicine

## 2021-10-07 ENCOUNTER — Encounter: Payer: Self-pay | Admitting: Sports Medicine

## 2021-10-07 ENCOUNTER — Other Ambulatory Visit: Payer: Self-pay | Admitting: Sports Medicine

## 2021-10-07 DIAGNOSIS — E785 Hyperlipidemia, unspecified: Secondary | ICD-10-CM

## 2021-10-07 MED ORDER — ATORVASTATIN CALCIUM 10 MG PO TABS: ORAL_TABLET | ORAL | 1 refills | Status: DC

## 2021-10-07 MED ORDER — AMPHETAMINE-DEXTROAMPHETAMINE 20 MG PO TABS: 20.0000 mg | ORAL_TABLET | Freq: Every day | ORAL | 0 refills | Status: DC

## 2021-10-28 ENCOUNTER — Encounter: Payer: Managed Care, Other (non HMO) | Admitting: Sports Medicine

## 2021-10-31 ENCOUNTER — Other Ambulatory Visit: Payer: Self-pay | Admitting: Sports Medicine

## 2021-10-31 DIAGNOSIS — E785 Hyperlipidemia, unspecified: Secondary | ICD-10-CM

## 2021-11-07 ENCOUNTER — Other Ambulatory Visit: Payer: Self-pay | Admitting: Sports Medicine

## 2021-11-08 ENCOUNTER — Encounter: Payer: Self-pay | Admitting: Sports Medicine

## 2021-11-08 ENCOUNTER — Other Ambulatory Visit: Payer: Self-pay | Admitting: Sports Medicine

## 2021-11-08 MED ORDER — AMPHETAMINE-DEXTROAMPHETAMINE 20 MG PO TABS: 20.0000 mg | ORAL_TABLET | Freq: Every day | ORAL | 0 refills | Status: DC

## 2021-11-22 ENCOUNTER — Encounter: Payer: Self-pay | Admitting: Sports Medicine

## 2021-11-25 ENCOUNTER — Ambulatory Visit (INDEPENDENT_AMBULATORY_CARE_PROVIDER_SITE_OTHER): Payer: Managed Care, Other (non HMO) | Admitting: Sports Medicine

## 2021-11-25 ENCOUNTER — Other Ambulatory Visit: Payer: Self-pay

## 2021-11-25 VITALS — BP 141/83 | HR 59 | Wt 193.0 lb

## 2021-11-25 DIAGNOSIS — Z91013 Allergy to seafood: Secondary | ICD-10-CM | POA: Insufficient documentation

## 2021-11-25 DIAGNOSIS — Z0000 Encounter for general adult medical examination without abnormal findings: Secondary | ICD-10-CM | POA: Diagnosis not present

## 2021-11-25 DIAGNOSIS — F411 Generalized anxiety disorder: Secondary | ICD-10-CM

## 2021-11-25 DIAGNOSIS — I10 Essential (primary) hypertension: Secondary | ICD-10-CM

## 2021-11-25 MED ORDER — ALPRAZOLAM 0.25 MG PO TABS: 0.2500 mg | ORAL_TABLET | Freq: Every evening | ORAL | 0 refills | Status: DC | PRN

## 2021-11-25 MED ORDER — EPINEPHRINE 0.3 MG/0.3ML IJ SOAJ: 0.3000 mg | INTRAMUSCULAR | 0 refills | Status: DC | PRN

## 2021-11-25 NOTE — Addendum Note (Signed)
Addended by: Silverio Decamp on: 11/25/2021 09:27 AM   Modules accepted: Orders

## 2021-11-25 NOTE — Progress Notes (Addendum)
Subjective:    CC: Annual Physical Exam  HPI:  This patient is here for their annual physical  I reviewed the past medical history, family history, social history, surgical history, and allergies today and no changes were needed.  Please see the problem list section below in epic for further details.  Past Medical History: Past Medical History:  Diagnosis Date   Anxiety    DDD (degenerative disc disease), cervical    Hemorrhoids    High cholesterol    Pre-diabetes    Past Surgical History: Past Surgical History:  Procedure Laterality Date   ABLATION     CESAREAN SECTION     x2   CHOLECYSTECTOMY     HEMORRHOID SURGERY     x 5- thrombosed hemorroids   SPINAL FUSION     cervical spine C3-5   TUBAL LIGATION     Social History: Social History   Socioeconomic History   Marital status: Married    Spouse name: Not on file   Number of children: Not on file   Years of education: Not on file   Highest education level: Not on file  Occupational History   Not on file  Tobacco Use   Smoking status: Former    Types: Cigarettes    Quit date: 06/23/2019    Years since quitting: 2.4   Smokeless tobacco: Never  Vaping Use   Vaping Use: Every day   Substances: Nicotine  Substance and Sexual Activity   Alcohol use: Yes    Comment: 2-4 q wk   Drug use: No   Sexual activity: Yes  Other Topics Concern   Not on file  Social History Narrative   Not on file   Social Determinants of Health   Financial Resource Strain: Not on file  Food Insecurity: Not on file  Transportation Needs: Not on file  Physical Activity: Not on file  Stress: Not on file  Social Connections: Not on file   Family History: Family History  Problem Relation Age of Onset   Cancer Maternal Aunt    Colon cancer Neg Hx    Colon polyps Neg Hx    Esophageal cancer Neg Hx    Rectal cancer Neg Hx    Stomach cancer Neg Hx    Allergies: Allergies  Allergen Reactions   Lidocaine Other (See Comments)     LIDOCAINE IS NOT EFFECTIVE FOR HER.  Tetracaine does work.   Medications: See med rec.  Review of Systems: No headache, visual changes, nausea, vomiting, diarrhea, constipation, dizziness, abdominal pain, skin rash, fevers, chills, night sweats, swollen lymph nodes, weight loss, chest pain, body aches, joint swelling, muscle aches, shortness of breath, mood changes, visual or auditory hallucinations.  Objective:    General: Well Developed, well nourished, and in no acute distress.  Neuro: Alert and oriented x3, extra-ocular muscles intact, sensation grossly intact. Cranial nerves II through XII are intact, motor, sensory, and coordinative functions are all intact. HEENT: Normocephalic, atraumatic, pupils equal round reactive to light, neck supple, no masses, no lymphadenopathy, thyroid nonpalpable. Oropharynx, nasopharynx, external ear canals are unremarkable. Skin: Warm and dry, no rashes noted.  Cardiac: Regular rate and rhythm, no murmurs rubs or gallops.  Respiratory: Clear to auscultation bilaterally. Not using accessory muscles, speaking in full sentences.  Abdominal: Soft, nontender, nondistended, positive bowel sounds, no masses, no organomegaly.  Musculoskeletal: Shoulder, elbow, wrist, hip, knee, ankle stable, and with full range of motion.  Impression and Recommendations:    The patient was counselled,  risk factors were discussed, anticipatory guidance given.  Annual physical exam Annual physical as above, declines vaccinations, though she will look into shingles and Tdap. Checking routine labs. Up-to-date on cervical cancer screening, breast cancer screening, she did have a colonoscopy this year, she did have a precancerous polyp and needs a 3-year recall colonoscopy.  Generalized anxiety disorder Under moderate control on current doses of citalopram, adding low-dose Xanax per her request.  Essential hypertension Blood pressure continues to run elevated, she will need to do  a diary at home and MyChart me.  Shellfish allergy Advised avoidance, adding EpiPen.   ___________________________________________ Gwen Her. Dianah Field, M.D., ABFM., CAQSM. Primary Care and Sports Medicine [facility]  Adjunct Professor of [employer] of [employer]

## 2021-11-25 NOTE — Assessment & Plan Note (Signed)
Annual physical as above, declines vaccinations, though she will look into shingles and Tdap. Checking routine labs. Up-to-date on cervical cancer screening, breast cancer screening, she did have a colonoscopy this year, she did have a precancerous polyp and needs a 3-year recall colonoscopy.

## 2021-11-25 NOTE — Assessment & Plan Note (Signed)
Under moderate control on current doses of citalopram, adding low-dose Xanax per her request.

## 2021-11-25 NOTE — Assessment & Plan Note (Signed)
Advised avoidance, adding EpiPen.

## 2021-11-25 NOTE — Assessment & Plan Note (Signed)
Blood pressure continues to run elevated, she will need to do a diary at home and MyChart me.

## 2021-11-26 LAB — COMPREHENSIVE METABOLIC PANEL
AG Ratio: 1.9 (calc) (ref 1.0–2.5)
ALT: 19 U/L (ref 6–29)
AST: 19 U/L (ref 10–35)
Albumin: 4.8 g/dL (ref 3.6–5.1)
Alkaline phosphatase (APISO): 67 U/L (ref 37–153)
BUN: 15 mg/dL (ref 7–25)
CO2: 28 mmol/L (ref 20–32)
Calcium: 9.6 mg/dL (ref 8.6–10.4)
Chloride: 103 mmol/L (ref 98–110)
Creat: 0.68 mg/dL (ref 0.50–1.03)
Globulin: 2.5 g/dL (calc) (ref 1.9–3.7)
Glucose, Bld: 112 mg/dL — ABNORMAL HIGH (ref 65–99)
Potassium: 4.3 mmol/L (ref 3.5–5.3)
Sodium: 141 mmol/L (ref 135–146)
Total Bilirubin: 0.3 mg/dL (ref 0.2–1.2)
Total Protein: 7.3 g/dL (ref 6.1–8.1)

## 2021-11-26 LAB — CBC
HCT: 41.7 % (ref 35.0–45.0)
Hemoglobin: 13.6 g/dL (ref 11.7–15.5)
MCH: 30.2 pg (ref 27.0–33.0)
MCHC: 32.6 g/dL (ref 32.0–36.0)
MCV: 92.5 fL (ref 80.0–100.0)
MPV: 12 fL (ref 7.5–12.5)
Platelets: 245 10*3/uL (ref 140–400)
RBC: 4.51 10*6/uL (ref 3.80–5.10)
RDW: 12 % (ref 11.0–15.0)
WBC: 6.3 10*3/uL (ref 3.8–10.8)

## 2021-11-26 LAB — LIPID PANEL
Cholesterol: 188 mg/dL (ref <200)
HDL: 84 mg/dL (ref >=50)
LDL Cholesterol (Calc): 89 mg/dL (calc)
Non-HDL Cholesterol (Calc): 104 mg/dL (calc) (ref <130)
Total CHOL/HDL Ratio: 2.2 (calc) (ref <5.0)
Triglycerides: 65 mg/dL (ref <150)

## 2021-11-26 LAB — TSH: TSH: 1.78 mIU/L

## 2021-12-01 ENCOUNTER — Other Ambulatory Visit: Payer: Self-pay | Admitting: Sports Medicine

## 2021-12-01 DIAGNOSIS — E785 Hyperlipidemia, unspecified: Secondary | ICD-10-CM

## 2021-12-11 ENCOUNTER — Other Ambulatory Visit: Payer: Self-pay | Admitting: Sports Medicine

## 2021-12-11 MED ORDER — AMPHETAMINE-DEXTROAMPHETAMINE 20 MG PO TABS
20.0000 mg | ORAL_TABLET | Freq: Every day | ORAL | 0 refills | Status: DC
Start: 2021-12-11 — End: 2021-12-13

## 2021-12-12 ENCOUNTER — Encounter: Payer: Self-pay | Admitting: Sports Medicine

## 2021-12-12 ENCOUNTER — Telehealth: Payer: Self-pay

## 2021-12-12 NOTE — Telephone Encounter (Signed)
Medication: amphetamine-dextroamphetamine (ADDERALL) 20 MG tablet Prior authorization determination received Medication has been approved Approval dates: 12/12/2021-12/12/2022  Patient aware via: [place] aware: Yes Provider aware via this encounter

## 2021-12-12 NOTE — Telephone Encounter (Signed)
Medication: amphetamine-dextroamphetamine (ADDERALL) 20 MG tablet Prior authorization submitted via CoverMyMeds on 12/12/2021 [place] submission pending

## 2021-12-13 ENCOUNTER — Telehealth: Payer: Self-pay | Admitting: Sports Medicine

## 2021-12-13 MED ORDER — AMPHETAMINE-DEXTROAMPHETAMINE 20 MG PO TABS: 20.0000 mg | ORAL_TABLET | Freq: Every day | ORAL | 0 refills | Status: DC

## 2021-12-13 NOTE — Addendum Note (Signed)
Addended by: Narda Rutherford on: 12/13/2021 03:56 PM   Modules accepted: Orders

## 2021-12-13 NOTE — Telephone Encounter (Signed)
PT called and said [facility] at [facility] in [place] has Adderal and they close at [place].

## 2021-12-13 NOTE — Telephone Encounter (Signed)
Patient called in stating the pharmacy her Adderall was sent to is out of stock. Ask if it could be sent to an pharmacy that has it please advise

## 2021-12-13 NOTE — Telephone Encounter (Signed)
See MyChart message

## 2021-12-13 NOTE — Addendum Note (Signed)
Addended by: Silverio Decamp on: 12/13/2021 04:38 PM   Modules accepted: Orders

## 2021-12-13 NOTE — Telephone Encounter (Signed)
Sent!

## 2022-01-09 ENCOUNTER — Other Ambulatory Visit: Payer: Self-pay | Admitting: Sports Medicine

## 2022-01-10 MED ORDER — AMPHETAMINE-DEXTROAMPHETAMINE 20 MG PO TABS: 20.0000 mg | ORAL_TABLET | Freq: Every day | ORAL | 0 refills | Status: DC

## 2022-01-11 ENCOUNTER — Encounter: Payer: Self-pay | Admitting: Sports Medicine

## 2022-02-11 ENCOUNTER — Other Ambulatory Visit: Payer: Self-pay | Admitting: Sports Medicine

## 2022-02-12 ENCOUNTER — Encounter: Payer: Self-pay | Admitting: Sports Medicine

## 2022-02-12 ENCOUNTER — Other Ambulatory Visit: Payer: Self-pay | Admitting: Sports Medicine

## 2022-02-12 MED ORDER — AMPHETAMINE-DEXTROAMPHETAMINE 20 MG PO TABS: 20.0000 mg | ORAL_TABLET | Freq: Every day | ORAL | 0 refills | Status: DC

## 2022-02-13 LAB — HM PAP SMEAR: HM Pap smear: NEGATIVE

## 2022-03-02 ENCOUNTER — Encounter: Payer: Self-pay | Admitting: Sports Medicine

## 2022-03-12 ENCOUNTER — Encounter: Payer: Self-pay | Admitting: Sports Medicine

## 2022-03-12 MED ORDER — AMPHETAMINE-DEXTROAMPHETAMINE 20 MG PO TABS
20.0000 mg | ORAL_TABLET | Freq: Every day | ORAL | 0 refills | Status: DC
Start: 2022-03-12 — End: 2022-04-10

## 2022-03-17 ENCOUNTER — Ambulatory Visit: Payer: Managed Care, Other (non HMO) | Admitting: Sports Medicine

## 2022-03-17 ENCOUNTER — Encounter: Payer: Self-pay | Admitting: Sports Medicine

## 2022-03-17 DIAGNOSIS — M5412 Radiculopathy, cervical region: Secondary | ICD-10-CM | POA: Diagnosis not present

## 2022-03-17 DIAGNOSIS — Z0000 Encounter for general adult medical examination without abnormal findings: Secondary | ICD-10-CM | POA: Diagnosis not present

## 2022-03-17 DIAGNOSIS — M5136 Other intervertebral disc degeneration, lumbar region: Secondary | ICD-10-CM

## 2022-03-17 DIAGNOSIS — M51369 Other intervertebral disc degeneration, lumbar region without mention of lumbar back pain or lower extremity pain: Secondary | ICD-10-CM

## 2022-03-17 MED ORDER — PREDNISONE 50 MG PO TABS: ORAL_TABLET | ORAL | 0 refills | Status: DC

## 2022-03-17 NOTE — Assessment & Plan Note (Signed)
Known lumbar spondylosis, also back pain particularly at work, standing on her feet all day, and working in a slightly flexed position for hours. ?She has had several injections, nothing is helping. ?Adding 5 days of prednisone, she will continue her back brace, I will add the advanced herniated disc conditioning exercises. ?

## 2022-03-17 NOTE — Assessment & Plan Note (Signed)
Up-to-date on screening measures, we will go ahead and scanned in her Pap results today. ?Declines vaccinations. ?

## 2022-03-17 NOTE — Progress Notes (Signed)
? ? ?  Procedures performed today:   ? ?None. ? ?Independent interpretation of notes and tests performed by another provider:  ? ?None. ? ?Brief History, Exam, Impression, and Recommendations:   ? ?Annual physical exam ?Up-to-date on screening measures, we will go ahead and scanned in her Pap results today. ?Declines vaccinations. ? ?Cervical radiculitis ?Status post ACDF C4-C6 a couple years ago, still has paresthesias going down the right arm, worse at night and with her arms bent, sometimes carpal tunnel distribution, sometimes ulnar nerve distribution. ?At this point I do think we need an updated nerve conduction and EMG bilateral upper extremity. ?Adding prednisone and doubling gabapentin at night. ? ?Lumbar degenerative disc disease ?Known lumbar spondylosis, also back pain particularly at work, standing on her feet all day, and working in a slightly flexed position for hours. ?She has had several injections, nothing is helping. ?Adding 5 days of prednisone, she will continue her back brace, I will add the advanced herniated disc conditioning exercises. ? ?Chronic process with exacerbation and pharmacologic intervention ? ?___________________________________________ ?Gwen Her. Dianah Field, M.D., ABFM., CAQSM. ?Primary Care and Sports Medicine ?[place] ? ?Adjunct Instructor of Family Medicine  ?[employer] ?

## 2022-03-17 NOTE — Assessment & Plan Note (Signed)
Status post ACDF C4-C6 a couple years ago, still has paresthesias going down the right arm, worse at night and with her arms bent, sometimes carpal tunnel distribution, sometimes ulnar nerve distribution. ?At this point I do think we need an updated nerve conduction and EMG bilateral upper extremity. ?Adding prednisone and doubling gabapentin at night. ?

## 2022-03-20 ENCOUNTER — Encounter: Payer: Self-pay | Admitting: Sports Medicine

## 2022-03-28 ENCOUNTER — Encounter: Payer: Self-pay | Admitting: Sports Medicine

## 2022-03-28 NOTE — Progress Notes (Signed)
Negative for intraepithelial lesion or malignancy.  

## 2022-03-31 ENCOUNTER — Other Ambulatory Visit: Payer: Self-pay | Admitting: Sports Medicine

## 2022-03-31 DIAGNOSIS — M5412 Radiculopathy, cervical region: Secondary | ICD-10-CM

## 2022-03-31 DIAGNOSIS — M5136 Other intervertebral disc degeneration, lumbar region: Secondary | ICD-10-CM

## 2022-03-31 DIAGNOSIS — F411 Generalized anxiety disorder: Secondary | ICD-10-CM

## 2022-04-02 ENCOUNTER — Other Ambulatory Visit: Payer: Self-pay | Admitting: Sports Medicine

## 2022-04-02 DIAGNOSIS — F411 Generalized anxiety disorder: Secondary | ICD-10-CM

## 2022-04-02 DIAGNOSIS — M5136 Other intervertebral disc degeneration, lumbar region: Secondary | ICD-10-CM

## 2022-04-02 DIAGNOSIS — M5412 Radiculopathy, cervical region: Secondary | ICD-10-CM

## 2022-04-02 MED ORDER — ALPRAZOLAM 0.25 MG PO TABS: 0.2500 mg | ORAL_TABLET | Freq: Every evening | ORAL | 0 refills | Status: DC | PRN

## 2022-04-02 MED ORDER — GABAPENTIN 300 MG PO CAPS: 600.0000 mg | ORAL_CAPSULE | Freq: Every day | ORAL | 3 refills | Status: DC

## 2022-04-09 MED ORDER — ALPRAZOLAM 0.25 MG PO TABS: 0.2500 mg | ORAL_TABLET | Freq: Every evening | ORAL | 3 refills | Status: DC | PRN

## 2022-04-09 MED ORDER — GABAPENTIN 300 MG PO CAPS: 600.0000 mg | ORAL_CAPSULE | Freq: Every day | ORAL | 3 refills | Status: DC

## 2022-04-10 ENCOUNTER — Other Ambulatory Visit: Payer: Self-pay | Admitting: Sports Medicine

## 2022-04-10 MED ORDER — AMPHETAMINE-DEXTROAMPHETAMINE 20 MG PO TABS: 20.0000 mg | ORAL_TABLET | Freq: Every day | ORAL | 0 refills | Status: DC

## 2022-04-28 ENCOUNTER — Encounter: Payer: Self-pay | Admitting: Sports Medicine

## 2022-05-05 ENCOUNTER — Encounter: Payer: Self-pay | Admitting: Sports Medicine

## 2022-05-06 ENCOUNTER — Encounter: Payer: Self-pay | Admitting: Sports Medicine

## 2022-05-06 ENCOUNTER — Other Ambulatory Visit: Payer: Self-pay | Admitting: Sports Medicine

## 2022-05-07 MED ORDER — AMPHETAMINE-DEXTROAMPHETAMINE 20 MG PO TABS: 20.0000 mg | ORAL_TABLET | Freq: Every day | ORAL | 0 refills | Status: DC

## 2022-05-09 ENCOUNTER — Other Ambulatory Visit: Payer: Self-pay | Admitting: Sports Medicine

## 2022-05-09 DIAGNOSIS — F411 Generalized anxiety disorder: Secondary | ICD-10-CM

## 2022-05-09 MED ORDER — AMPHETAMINE-DEXTROAMPHETAMINE 20 MG PO TABS: 20.0000 mg | ORAL_TABLET | Freq: Every day | ORAL | 0 refills | Status: DC

## 2022-05-30 ENCOUNTER — Other Ambulatory Visit: Payer: Self-pay | Admitting: Sports Medicine

## 2022-05-30 DIAGNOSIS — E785 Hyperlipidemia, unspecified: Secondary | ICD-10-CM

## 2022-06-02 ENCOUNTER — Encounter (INDEPENDENT_AMBULATORY_CARE_PROVIDER_SITE_OTHER): Payer: Managed Care, Other (non HMO) | Admitting: Sports Medicine

## 2022-06-02 DIAGNOSIS — M5136 Other intervertebral disc degeneration, lumbar region: Secondary | ICD-10-CM | POA: Diagnosis not present

## 2022-06-02 DIAGNOSIS — E785 Hyperlipidemia, unspecified: Secondary | ICD-10-CM

## 2022-06-02 DIAGNOSIS — M5412 Radiculopathy, cervical region: Secondary | ICD-10-CM

## 2022-06-02 DIAGNOSIS — M51369 Other intervertebral disc degeneration, lumbar region without mention of lumbar back pain or lower extremity pain: Secondary | ICD-10-CM

## 2022-06-02 MED ORDER — GABAPENTIN 300 MG PO CAPS: 600.0000 mg | ORAL_CAPSULE | Freq: Every day | ORAL | 3 refills | Status: DC

## 2022-06-02 NOTE — Telephone Encounter (Signed)
I spent 5 total minutes of online digital evaluation and management services in this patient-initiated request for online care. 

## 2022-06-03 MED ORDER — ATORVASTATIN CALCIUM 10 MG PO TABS: 5.0000 mg | ORAL_TABLET | Freq: Every day | ORAL | 3 refills | Status: DC

## 2022-06-03 MED ORDER — GABAPENTIN 300 MG PO CAPS: 600.0000 mg | ORAL_CAPSULE | Freq: Every day | ORAL | 3 refills | Status: DC

## 2022-06-03 NOTE — Addendum Note (Signed)
Addended by: Silverio Decamp on: 06/03/2022 09:17 AM   Modules accepted: Orders

## 2022-06-06 ENCOUNTER — Other Ambulatory Visit: Payer: Self-pay | Admitting: Sports Medicine

## 2022-06-06 MED ORDER — AMPHETAMINE-DEXTROAMPHETAMINE 20 MG PO TABS: 20.0000 mg | ORAL_TABLET | Freq: Every day | ORAL | 0 refills | Status: DC

## 2022-06-08 ENCOUNTER — Other Ambulatory Visit: Payer: Self-pay | Admitting: Sports Medicine

## 2022-06-16 ENCOUNTER — Encounter: Payer: Self-pay | Admitting: Sports Medicine

## 2022-06-16 ENCOUNTER — Ambulatory Visit: Payer: Managed Care, Other (non HMO) | Admitting: Sports Medicine

## 2022-06-16 ENCOUNTER — Ambulatory Visit (INDEPENDENT_AMBULATORY_CARE_PROVIDER_SITE_OTHER): Payer: Managed Care, Other (non HMO)

## 2022-06-16 DIAGNOSIS — G5603 Carpal tunnel syndrome, bilateral upper limbs: Secondary | ICD-10-CM | POA: Diagnosis not present

## 2022-06-16 DIAGNOSIS — E669 Obesity, unspecified: Secondary | ICD-10-CM

## 2022-06-16 DIAGNOSIS — F909 Attention-deficit hyperactivity disorder, unspecified type: Secondary | ICD-10-CM

## 2022-06-16 MED ORDER — AMPHETAMINE-DEXTROAMPHETAMINE 20 MG PO TABS
20.0000 mg | ORAL_TABLET | Freq: Every day | ORAL | 0 refills | Status: DC
Start: 2022-06-16 — End: 2022-07-26

## 2022-06-16 MED ORDER — WEGOVY 0.25 MG/0.5ML ~~LOC~~ SOAJ: 0.2500 mg | SUBCUTANEOUS | 0 refills | Status: DC

## 2022-06-16 MED ORDER — ONDANSETRON 8 MG PO TBDP: 8.0000 mg | ORAL_TABLET | Freq: Three times a day (TID) | ORAL | 3 refills | Status: DC | PRN

## 2022-06-16 NOTE — Assessment & Plan Note (Signed)
Obesity, has tried exercise prescriptions and calorie counting, she will be part of a multidisciplinary weight loss program, starting YJEHUD. Adding Zofran for expected nausea.

## 2022-06-16 NOTE — Progress Notes (Signed)
    Procedures performed today:    Procedure: Real-time Ultrasound Guided hydrodissection of the right median nerve at the carpal tunnel Device: Samsung [id number] Verbal informed consent obtained.  Time-out conducted.  Noted no overlying erythema, induration, or other signs of local infection.  Skin prepped in a sterile fashion.  Local anesthesia: Topical Ethyl chloride.  With sterile technique and under real time ultrasound guidance: Noted enlarged nerve, using a 25-gauge needle advanced into the carpal tunnel, taking care to avoid intraneural injection I injected medication both superficial to and deep to the median nerve freeing it from surrounding structures, I then redirected the needle deep and injected further medication around the flexor tendons deep within the carpal tunnel for a total of 1 cc kenalog 40, 5 cc 1% lidocaine without epinephrine. Completed without difficulty  Advised to call if fevers/chills, erythema, induration, drainage, or persistent bleeding.  Images permanently stored and available for review in PACS.  Impression: Technically successful ultrasound guided median nerve hydrodissection.  Independent interpretation of notes and tests performed by another provider:   None.  Brief History, Exam, Impression, and Recommendations:    Bilateral carpal tunnel syndrome Bilateral hand numbness and tingling, she is wearing nighttime splints, resolved on its own about 4 years ago. Now with recurrence of bilateral hand numbness and tingling into the first through fourth fingers, nerve conduction study at [facility] June of this year did confirm bilateral carpal tunnel syndrome without evidence of ulnar neuropathy or cervical radiculopathy.  Per patient request today we did a right median nerve hydrodissection, return to see me in 6 weeks  Obesity Obesity, has tried exercise prescriptions and calorie counting, she will be part of a multidisciplinary weight loss  program, starting ZJQBHA. Adding Zofran for expected nausea.  Adult ADHD Needs a refill on Adderall. Never picked up the prescription that was sent approximately 8 days ago.  I spent 40 minutes of total time managing this patient today, this includes chart review, face to face, and non-face to face time.  This was separate from the time spent performing the above procedure.  We managed multiple medical problems today.   ____________________________________________ Gwen Her. Dianah Field, M.D., ABFM., CAQSM., AME. Primary Care and Sports Medicine [facility]  Adjunct Professor of [place] [employer] of Medicine  Risk manager

## 2022-06-16 NOTE — Assessment & Plan Note (Signed)
Bilateral hand numbness and tingling, she is wearing nighttime splints, resolved on its own about 4 years ago. Now with recurrence of bilateral hand numbness and tingling into the first through fourth fingers, nerve conduction study at [facility] June of this year did confirm bilateral carpal tunnel syndrome without evidence of ulnar neuropathy or cervical radiculopathy.  Per patient request today we did a right median nerve hydrodissection, return to see me in 6 weeks

## 2022-06-16 NOTE — Assessment & Plan Note (Signed)
Needs a refill on Adderall. Never picked up the prescription that was sent approximately 8 days ago.

## 2022-07-26 ENCOUNTER — Other Ambulatory Visit: Payer: Self-pay | Admitting: Sports Medicine

## 2022-07-26 DIAGNOSIS — F411 Generalized anxiety disorder: Secondary | ICD-10-CM

## 2022-07-29 ENCOUNTER — Encounter: Payer: Self-pay | Admitting: Sports Medicine

## 2022-07-30 MED ORDER — ALPRAZOLAM 0.25 MG PO TABS: 0.2500 mg | ORAL_TABLET | Freq: Every evening | ORAL | 0 refills | Status: DC | PRN

## 2022-07-30 MED ORDER — AMPHETAMINE-DEXTROAMPHETAMINE 20 MG PO TABS: 20.0000 mg | ORAL_TABLET | Freq: Every day | ORAL | 0 refills | Status: DC

## 2022-08-12 ENCOUNTER — Encounter (INDEPENDENT_AMBULATORY_CARE_PROVIDER_SITE_OTHER): Payer: Managed Care, Other (non HMO) | Admitting: Sports Medicine

## 2022-08-12 DIAGNOSIS — E669 Obesity, unspecified: Secondary | ICD-10-CM

## 2022-08-13 MED ORDER — SEMAGLUTIDE (2 MG/DOSE) 8 MG/3ML ~~LOC~~ SOPN: PEN_INJECTOR | SUBCUTANEOUS | 3 refills | Status: DC

## 2022-08-13 NOTE — Telephone Encounter (Signed)
I spent 5 total minutes of online digital evaluation and management services in this patient-initiated request for online care. 

## 2022-08-28 ENCOUNTER — Other Ambulatory Visit: Payer: Self-pay | Admitting: Sports Medicine

## 2022-08-28 MED ORDER — AMPHETAMINE-DEXTROAMPHETAMINE 20 MG PO TABS: 20.0000 mg | ORAL_TABLET | Freq: Every day | ORAL | 0 refills | Status: DC

## 2022-09-28 ENCOUNTER — Other Ambulatory Visit: Payer: Self-pay | Admitting: Sports Medicine

## 2022-09-30 ENCOUNTER — Encounter: Payer: Self-pay | Admitting: Sports Medicine

## 2022-09-30 MED ORDER — AMPHETAMINE-DEXTROAMPHETAMINE 20 MG PO TABS: 20.0000 mg | ORAL_TABLET | Freq: Every day | ORAL | 0 refills | Status: DC

## 2022-10-27 ENCOUNTER — Other Ambulatory Visit: Payer: Self-pay | Admitting: Sports Medicine

## 2022-10-27 ENCOUNTER — Encounter: Payer: Self-pay | Admitting: Sports Medicine

## 2022-10-27 MED ORDER — AMPHETAMINE-DEXTROAMPHETAMINE 20 MG PO TABS: 20.0000 mg | ORAL_TABLET | Freq: Every day | ORAL | 0 refills | Status: DC

## 2022-11-13 ENCOUNTER — Other Ambulatory Visit: Payer: Self-pay | Admitting: Sports Medicine

## 2022-11-13 DIAGNOSIS — M5136 Other intervertebral disc degeneration, lumbar region: Secondary | ICD-10-CM

## 2022-11-13 DIAGNOSIS — M5412 Radiculopathy, cervical region: Secondary | ICD-10-CM

## 2022-11-18 ENCOUNTER — Ambulatory Visit: Payer: Managed Care, Other (non HMO) | Admitting: Sports Medicine

## 2022-11-18 ENCOUNTER — Ambulatory Visit (INDEPENDENT_AMBULATORY_CARE_PROVIDER_SITE_OTHER): Payer: Managed Care, Other (non HMO)

## 2022-11-18 DIAGNOSIS — M19079 Primary osteoarthritis, unspecified ankle and foot: Secondary | ICD-10-CM | POA: Diagnosis not present

## 2022-11-18 DIAGNOSIS — G5603 Carpal tunnel syndrome, bilateral upper limbs: Secondary | ICD-10-CM

## 2022-11-18 MED ORDER — TRIAMCINOLONE ACETONIDE 40 MG/ML IJ SUSP
40.0000 mg | Freq: Once | INTRAMUSCULAR | Status: AC
  Administered 2022-11-18: 40 mg via INTRAMUSCULAR

## 2022-11-18 NOTE — Assessment & Plan Note (Signed)
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 Return to see me for injection if not better. She can use NSAIDs for 2 weeks.

## 2022-11-18 NOTE — Assessment & Plan Note (Signed)
Pleasant 52 year old female, bilateral carpal tunnel syndrome, now with recurrence of right-sided numbness and tingling, we did a nerve hydrodissection back in July 2023, she did really well until today, repeat right-sided median nerve hydrodissection.

## 2022-11-18 NOTE — Progress Notes (Signed)
    Procedures performed today:    Procedure: Real-time Ultrasound Guided hydrodissection of the right median nerve at the carpal tunnel Device: Samsung HS60 Verbal informed consent obtained.  Time-out conducted.  Noted no overlying erythema, induration, or other signs of local infection.  Skin prepped in a sterile fashion.  Local anesthesia: Topical Ethyl chloride.  With sterile technique and under real time ultrasound guidance: Enlarged nerve noted using a 25-gauge needle advanced into the carpal tunnel, taking care to avoid intraneural injection I injected medication both superficial to and deep to the median nerve freeing it from surrounding structures, I then redirected the needle deep and injected further medication around the flexor tendons deep within the carpal tunnel for a total of 1 cc kenalog 40, 5 cc 1% lidocaine without epinephrine. Completed without difficulty  Advised to call if fevers/chills, erythema, induration, drainage, or persistent bleeding.  Images permanently stored and available for review in PACS.  Impression: Technically successful ultrasound guided median nerve hydrodissection.  Independent interpretation of notes and tests performed by another provider:   None.  Brief History, Exam, Impression, and Recommendations:    Bilateral carpal tunnel syndrome Pleasant 52 year old female, bilateral carpal tunnel syndrome, now with recurrence of right-sided numbness and tingling, we did a nerve hydrodissection back in July 2023, she did really well until today, repeat right-sided median nerve hydrodissection.  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 Return to see me for injection if not better. She can use NSAIDs for 2 weeks.    ____________________________________________ Gwen Her.  Dianah Field, M.D., ABFM., CAQSM., AME. Primary Care and Sports Medicine [place] [facility]  Adjunct Professor of [employer] [facility] [employer]  Risk manager

## 2022-11-27 ENCOUNTER — Encounter: Payer: Self-pay | Admitting: Sports Medicine

## 2022-11-27 ENCOUNTER — Other Ambulatory Visit: Payer: Self-pay | Admitting: Sports Medicine

## 2022-11-28 MED ORDER — AMPHETAMINE-DEXTROAMPHETAMINE 20 MG PO TABS: 20.0000 mg | ORAL_TABLET | Freq: Every day | ORAL | 0 refills | Status: DC

## 2022-12-08 ENCOUNTER — Encounter: Payer: Self-pay | Admitting: Sports Medicine

## 2022-12-08 ENCOUNTER — Ambulatory Visit (INDEPENDENT_AMBULATORY_CARE_PROVIDER_SITE_OTHER): Payer: Managed Care, Other (non HMO) | Admitting: Sports Medicine

## 2022-12-08 VITALS — BP 127/82 | HR 96 | Wt 182.0 lb

## 2022-12-08 DIAGNOSIS — Z23 Encounter for immunization: Secondary | ICD-10-CM | POA: Diagnosis not present

## 2022-12-08 DIAGNOSIS — I10 Essential (primary) hypertension: Secondary | ICD-10-CM

## 2022-12-08 DIAGNOSIS — E669 Obesity, unspecified: Secondary | ICD-10-CM | POA: Diagnosis not present

## 2022-12-08 DIAGNOSIS — Z0000 Encounter for general adult medical examination without abnormal findings: Secondary | ICD-10-CM

## 2022-12-08 DIAGNOSIS — R7303 Prediabetes: Secondary | ICD-10-CM | POA: Diagnosis not present

## 2022-12-08 NOTE — Assessment & Plan Note (Signed)
Having some good weight loss on compounded semaglutide. She is switching charts so we could certainly try to get her approved for [facility] or Zepbound in the future.

## 2022-12-08 NOTE — Progress Notes (Signed)
Subjective:    CC: Annual Physical Exam  HPI:  This patient is here for their annual physical  I reviewed the past medical history, family history, social history, surgical history, and allergies today and no changes were needed.  Please see the problem list section below in epic for further details.  Past Medical History: Past Medical History:  Diagnosis Date   Anxiety    DDD (degenerative disc disease), cervical    Hemorrhoids    High cholesterol    Pre-diabetes    Past Surgical History: Past Surgical History:  Procedure Laterality Date   ABLATION     CESAREAN SECTION     x2   CHOLECYSTECTOMY     HEMORRHOID SURGERY     x 5- thrombosed hemorroids   SPINAL FUSION     cervical spine C3-5   TUBAL LIGATION     Social History: Social History   Socioeconomic History   Marital status: Married    Spouse name: Not on file   Number of children: Not on file   Years of education: Not on file   Highest education level: Not on file  Occupational History   Not on file  Tobacco Use   Smoking status: Former    Types: Cigarettes    Quit date: 06/23/2019    Years since quitting: 3.4   Smokeless tobacco: Never  Vaping Use   Vaping Use: Every day   Substances: Nicotine  Substance and Sexual Activity   Alcohol use: Yes    Comment: 2-4 q wk   Drug use: No   Sexual activity: Yes  Other Topics Concern   Not on file  Social History Narrative   Not on file   Social Determinants of Health   Financial Resource Strain: Not on file  Food Insecurity: Not on file  Transportation Needs: Not on file  Physical Activity: Not on file  Stress: Not on file  Social Connections: Not on file   Family History: Family History  Problem Relation Age of Onset   Cancer Maternal Aunt    Colon cancer Neg Hx    Colon polyps Neg Hx    Esophageal cancer Neg Hx    Rectal cancer Neg Hx    Stomach cancer Neg Hx    Allergies: Allergies  Allergen Reactions   Lidocaine Other (See Comments)     LIDOCAINE IS NOT EFFECTIVE FOR HER.  Tetracaine does work.   Medications: See med rec.  Review of Systems: No headache, visual changes, nausea, vomiting, diarrhea, constipation, dizziness, abdominal pain, skin rash, fevers, chills, night sweats, swollen lymph nodes, weight loss, chest pain, body aches, joint swelling, muscle aches, shortness of breath, mood changes, visual or auditory hallucinations.  Objective:    General: Well Developed, well nourished, and in no acute distress.  Neuro: Alert and oriented x3, extra-ocular muscles intact, sensation grossly intact. Cranial nerves II through XII are intact, motor, sensory, and coordinative functions are all intact. HEENT: Normocephalic, atraumatic, pupils equal round reactive to light, neck supple, no masses, no lymphadenopathy, thyroid nonpalpable. Oropharynx, nasopharynx, external ear canals are unremarkable. Skin: Warm and dry, no rashes noted.  Cardiac: Regular rate and rhythm, no murmurs rubs or gallops.  Respiratory: Clear to auscultation bilaterally. Not using accessory muscles, speaking in full sentences.  Abdominal: Soft, nontender, nondistended, positive bowel sounds, no masses, no organomegaly.  Musculoskeletal: Shoulder, elbow, wrist, hip, knee, ankle stable, and with full range of motion.  Impression and Recommendations:    The patient was counselled,  risk factors were discussed, anticipatory guidance given.  Annual physical exam Annual physical as above. Up-to-date on colon cancer screening. Ordering mammogram. Declines flu and Tdap. Starting Shingrix today.  Nurse visit 2 to 6 months for Shingrix No. 2.  Obesity Having some good weight loss on compounded semaglutide. She is switching charts so we could certainly try to get her approved for [facility] or Zepbound in the future.   ____________________________________________ Gwen Her. Dianah Field, M.D., ABFM., CAQSM., AME. Primary Care and Sports Medicine [place]  [facility]  Adjunct Professor of [place] of [facility] [employer] Medicine  Risk manager

## 2022-12-08 NOTE — Assessment & Plan Note (Addendum)
Annual physical as above. Up-to-date on colon cancer screening. Ordering mammogram. Declines flu and Tdap. Starting Shingrix today.  Nurse visit 2 to 6 months for Shingrix No. 2.

## 2022-12-08 NOTE — Addendum Note (Signed)
Addended by: Tarri Glenn A on: 12/08/2022 02:24 PM   Modules accepted: Orders

## 2022-12-10 ENCOUNTER — Encounter: Payer: Self-pay | Admitting: Sports Medicine

## 2022-12-10 MED ORDER — DOXYCYCLINE HYCLATE 100 MG PO TABS: 100.0000 mg | ORAL_TABLET | Freq: Two times a day (BID) | ORAL | 0 refills | Status: AC

## 2022-12-12 ENCOUNTER — Telehealth: Payer: Self-pay

## 2022-12-12 NOTE — Telephone Encounter (Signed)
Initiated Prior authorization JKK:[id number] '20MG'$  tablets Via: Covermymeds Case/Key:[id number] Status: n/a as of 12/12/22 Reason:Drug is covered by current benefit plan. No further PA activity needed

## 2022-12-22 ENCOUNTER — Encounter: Payer: Self-pay | Admitting: Sports Medicine

## 2022-12-22 DIAGNOSIS — M51369 Other intervertebral disc degeneration, lumbar region without mention of lumbar back pain or lower extremity pain: Secondary | ICD-10-CM

## 2022-12-22 DIAGNOSIS — M5136 Other intervertebral disc degeneration, lumbar region: Secondary | ICD-10-CM

## 2022-12-22 DIAGNOSIS — M5412 Radiculopathy, cervical region: Secondary | ICD-10-CM

## 2022-12-22 MED ORDER — GABAPENTIN 100 MG PO CAPS: 100.0000 mg | ORAL_CAPSULE | Freq: Every day | ORAL | 3 refills | Status: DC

## 2022-12-23 LAB — COMPLETE METABOLIC PANEL WITH GFR
AG Ratio: 2 (calc) (ref 1.0–2.5)
ALT: 22 U/L (ref 6–29)
AST: 19 U/L (ref 10–35)
Albumin: 4.7 g/dL (ref 3.6–5.1)
Alkaline phosphatase (APISO): 75 U/L (ref 37–153)
BUN: 14 mg/dL (ref 7–25)
CO2: 31 mmol/L (ref 20–32)
Calcium: 9.7 mg/dL (ref 8.6–10.4)
Chloride: 104 mmol/L (ref 98–110)
Creat: 0.92 mg/dL (ref 0.50–1.03)
Globulin: 2.4 g/dL (calc) (ref 1.9–3.7)
Glucose, Bld: 93 mg/dL (ref 65–99)
Potassium: 4.5 mmol/L (ref 3.5–5.3)
Sodium: 142 mmol/L (ref 135–146)
Total Bilirubin: 0.4 mg/dL (ref 0.2–1.2)
Total Protein: 7.1 g/dL (ref 6.1–8.1)
eGFR: 74 mL/min/{1.73_m2} (ref >=60)

## 2022-12-23 LAB — CBC
HCT: 42.8 % (ref 35.0–45.0)
Hemoglobin: 14.2 g/dL (ref 11.7–15.5)
MCH: 29.6 pg (ref 27.0–33.0)
MCHC: 33.2 g/dL (ref 32.0–36.0)
MCV: 89.2 fL (ref 80.0–100.0)
MPV: 11.6 fL (ref 7.5–12.5)
Platelets: 259 10*3/uL (ref 140–400)
RBC: 4.8 10*6/uL (ref 3.80–5.10)
RDW: 12.3 % (ref 11.0–15.0)
WBC: 6.3 10*3/uL (ref 3.8–10.8)

## 2022-12-23 LAB — HEMOGLOBIN A1C
Hgb A1c MFr Bld: 5.9 % of total Hgb — ABNORMAL HIGH (ref <5.7)
Mean Plasma Glucose: 123 mg/dL
eAG (mmol/L): 6.8 mmol/L

## 2022-12-23 LAB — LIPID PANEL
Cholesterol: 177 mg/dL (ref <200)
HDL: 68 mg/dL (ref >=50)
LDL Cholesterol (Calc): 88 mg/dL (calc)
Non-HDL Cholesterol (Calc): 109 mg/dL (calc) (ref <130)
Total CHOL/HDL Ratio: 2.6 (calc) (ref <5.0)
Triglycerides: 115 mg/dL (ref <150)

## 2022-12-23 LAB — TSH: TSH: 3.38 mIU/L

## 2022-12-25 ENCOUNTER — Other Ambulatory Visit: Payer: Self-pay | Admitting: Sports Medicine

## 2022-12-26 MED ORDER — AMPHETAMINE-DEXTROAMPHETAMINE 20 MG PO TABS: 20.0000 mg | ORAL_TABLET | Freq: Every day | ORAL | 0 refills | Status: DC

## 2023-01-25 ENCOUNTER — Other Ambulatory Visit: Payer: Self-pay | Admitting: Sports Medicine

## 2023-01-26 MED ORDER — AMPHETAMINE-DEXTROAMPHETAMINE 20 MG PO TABS: 20.0000 mg | ORAL_TABLET | Freq: Every day | ORAL | 0 refills | Status: DC

## 2023-02-25 ENCOUNTER — Ambulatory Visit (INDEPENDENT_AMBULATORY_CARE_PROVIDER_SITE_OTHER): Payer: Managed Care, Other (non HMO)

## 2023-02-25 ENCOUNTER — Other Ambulatory Visit: Payer: Self-pay | Admitting: Sports Medicine

## 2023-02-25 DIAGNOSIS — Z1231 Encounter for screening mammogram for malignant neoplasm of breast: Secondary | ICD-10-CM | POA: Diagnosis not present

## 2023-02-25 DIAGNOSIS — Z0000 Encounter for general adult medical examination without abnormal findings: Secondary | ICD-10-CM

## 2023-02-25 MED ORDER — AMPHETAMINE-DEXTROAMPHETAMINE 20 MG PO TABS: 20.0000 mg | ORAL_TABLET | Freq: Every day | ORAL | 0 refills | Status: DC

## 2023-03-30 ENCOUNTER — Encounter (INDEPENDENT_AMBULATORY_CARE_PROVIDER_SITE_OTHER): Payer: Managed Care, Other (non HMO) | Admitting: Sports Medicine

## 2023-03-30 DIAGNOSIS — G5603 Carpal tunnel syndrome, bilateral upper limbs: Secondary | ICD-10-CM

## 2023-03-31 NOTE — Telephone Encounter (Signed)
I spent 5 total minutes of online digital evaluation and management services in this patient-initiated request for online care. 

## 2023-04-09 ENCOUNTER — Other Ambulatory Visit: Payer: Self-pay | Admitting: Sports Medicine

## 2023-04-09 MED ORDER — AMPHETAMINE-DEXTROAMPHETAMINE 20 MG PO TABS: 20.0000 mg | ORAL_TABLET | Freq: Every day | ORAL | 0 refills | Status: DC

## 2023-04-20 ENCOUNTER — Ambulatory Visit: Payer: Managed Care, Other (non HMO)

## 2023-04-24 ENCOUNTER — Ambulatory Visit (INDEPENDENT_AMBULATORY_CARE_PROVIDER_SITE_OTHER): Payer: Managed Care, Other (non HMO) | Admitting: Sports Medicine

## 2023-04-24 VITALS — Temp 97.6°F

## 2023-04-24 DIAGNOSIS — Z23 Encounter for immunization: Secondary | ICD-10-CM | POA: Diagnosis not present

## 2023-04-24 NOTE — Progress Notes (Unsigned)
   Established Patient Office Visit  Subjective   Patient ID: April [name], female    DOB: [birth year]  Age: 52 y.o. MRN: [id number]  Chief Complaint  Patient presents with   2nd shingrix vaccine    Nurse visit for 2nd shingrix vaccine    HPI  2nd shingrix vaccine - initial vaccine given 12/08/2022. Patient denies complications with previous vaccine given.   ROS    Objective:     Temp 97.6 F (36.4 C)  {Vitals History (Optional):23777}  Physical Exam   No results found for any visits on 04/24/23.  {Labs (Optional):23779}  The 10-year ASCVD risk score (Arnett DK, et al., 2019) is: 2.9%    Assessment & Plan:  Shingrix vaccine admin RUOQ IM. Patient tolerated injection well without complications.  Problem List Items Addressed This Visit   None   No follow-ups on file.    Elizabeth Palau, LPN

## 2023-05-08 ENCOUNTER — Other Ambulatory Visit: Payer: Self-pay | Admitting: Sports Medicine

## 2023-05-08 MED ORDER — AMPHETAMINE-DEXTROAMPHETAMINE 20 MG PO TABS: 20.0000 mg | ORAL_TABLET | Freq: Every day | ORAL | 0 refills | Status: DC

## 2023-05-08 NOTE — Telephone Encounter (Signed)
Requesting rx rf of ADDErall 20mg   Last written 04/09/2023 Last OV 12/08/2022 ( No ADHD dx) No upcomng appt schld.

## 2023-05-11 ENCOUNTER — Ambulatory Visit
Admission: EM | Admit: 2023-05-11 | Discharge: 2023-05-11 | Disposition: A | Discharge status: Home / Self Care | Payer: Managed Care, Other (non HMO) | Attending: Family Medicine | Admitting: Family Medicine

## 2023-05-11 ENCOUNTER — Other Ambulatory Visit: Payer: Self-pay | Admitting: Sports Medicine

## 2023-05-11 ENCOUNTER — Encounter: Payer: Self-pay | Admitting: Emergency Medicine

## 2023-05-11 DIAGNOSIS — T63441A Toxic effect of venom of bees, accidental (unintentional), initial encounter: Secondary | ICD-10-CM

## 2023-05-11 DIAGNOSIS — L03114 Cellulitis of left upper limb: Secondary | ICD-10-CM

## 2023-05-11 DIAGNOSIS — Z91013 Allergy to seafood: Secondary | ICD-10-CM

## 2023-05-11 DIAGNOSIS — F411 Generalized anxiety disorder: Secondary | ICD-10-CM

## 2023-05-11 HISTORY — DX: Dizziness and giddiness: R42

## 2023-05-11 MED ORDER — METHYLPREDNISOLONE ACETATE 80 MG/ML IJ SUSP
80.0000 mg | Freq: Once | INTRAMUSCULAR | Status: AC
  Administered 2023-05-11: 80 mg via INTRAMUSCULAR

## 2023-05-11 MED ORDER — EPINEPHRINE 0.3 MG/0.3ML IJ SOAJ
0.3000 mg | INTRAMUSCULAR | 1 refills | Status: AC | PRN
Start: 2023-05-11 — End: ?

## 2023-05-11 MED ORDER — CEPHALEXIN 500 MG PO CAPS: 1000.0000 mg | ORAL_CAPSULE | Freq: Two times a day (BID) | ORAL | 0 refills | Status: AC

## 2023-05-11 NOTE — ED Provider Notes (Addendum)
[facility]    CSN: [id number] Arrival date & time: 05/11/23  1453      History   Chief Complaint Chief Complaint  Patient presents with   Rash    HPI April [name] is a 52 y.o. female.   HPI 52 year old female presents with bee sting to left arm 3 days ago and now forearm is swollen and red in color.  She reports that she was stung by honeybee while on fluid in her pool 3 days ago.  PMH significant for obesity, cervical DDD, and anxiety.  Past Medical History:  Diagnosis Date   Anxiety    DDD (degenerative disc disease), cervical    Hemorrhoids    High cholesterol    Pre-diabetes    Vertigo     Patient Active Problem List   Diagnosis Date Noted   Primary osteoarthritis of first metatarsophalangeal (MTP) joint of right foot 11/18/2022   Shellfish allergy 11/25/2021   Hemorrhoids 01/21/2021   Seborrheic keratosis right side of the nose near the medial canthus 10/08/2020   Lumbar degenerative disc disease 12/26/2019   Internal derangement of right knee 08/24/2019   Onychomycosis 08/01/2019   GERD (gastroesophageal reflux disease) 03/15/2019   Essential hypertension 12/20/2018   Bilateral carpal tunnel syndrome 08/02/2018   Hyperlipidemia LDL goal <100 07/14/2017   Current smoker 02/12/2016   Adult ADHD 11/15/2015   Rosacea 11/15/2015   Obesity 06/02/2014   Cervical radiculitis 05/05/2014   Actinic keratosis of left cheek 03/17/2013   Generalized anxiety disorder 03/15/2013   Insomnia 03/15/2013   Annual physical exam 03/15/2013    Past Surgical History:  Procedure Laterality Date   ABLATION     CESAREAN SECTION     x2   CHOLECYSTECTOMY     HEMORRHOID SURGERY     x 5- thrombosed hemorroids   SPINAL FUSION     cervical spine C3-5   TUBAL LIGATION      OB History   No obstetric history on file.      Home Medications    Prior to Admission medications   Medication Sig Start Date End Date Taking? Authorizing Provider  cephALEXin  (KEFLEX) 500 MG capsule Take 2 capsules (1,000 mg total) by mouth 2 (two) times daily for 7 days. 05/11/23 05/18/23 Yes Trevor Iha, FNP  ALPRAZolam Prudy Feeler) 0.25 MG tablet Take 1 tablet (0.25 mg total) by mouth at bedtime as needed for anxiety. 07/30/22   Monica Becton, MD  amphetamine-dextroamphetamine (ADDERALL) 20 MG tablet Take 1 tablet (20 mg total) by mouth daily. 05/08/23   Monica Becton, MD  atorvastatin (LIPITOR) 10 MG tablet Take 0.5 tablets (5 mg total) by mouth daily. 06/03/22   Monica Becton, MD  citalopram (CELEXA) 20 MG tablet TAKE 1 AND 1/2 TABLETS BY MOUTH DAILY 05/09/22   Monica Becton, MD  EPINEPHrine (EPIPEN 2-PAK) 0.3 mg/0.3 mL IJ SOAJ injection Inject 0.3 mg into the muscle as needed for anaphylaxis. 05/11/23   Trevor Iha, FNP  gabapentin (NEURONTIN) 100 MG capsule Take 1 capsule (100 mg total) by mouth at bedtime. 12/22/22   Monica Becton, MD  Melatonin 3 MG CAPS Take 1 capsule by mouth at bedtime.    [provider]  Semaglutide, 2 MG/DOSE, 8 MG/3ML SOPN Semaglutide 2.5 mg/mL + VIt B6 10mg /mL.  Inject 10u/0.31mL/0.25 mg subcu weekly x4 weeks then 20u/0.99mL/0.5 mg subcu weekly x4 weeks, then 40u/0.72mL/1 mg subcu weekly x4 weeks then 68u/0.37mL/1.7mg  subcu weekly x4 weeks then 100u/52mL/2.5mg  subcu weekly.  Patient not taking: Reported on 05/11/2023 08/13/22   Monica Becton, MD    Family History Family History  Problem Relation Age of Onset   Cancer Maternal Aunt    Colon cancer Neg Hx    Colon polyps Neg Hx    Esophageal cancer Neg Hx    Rectal cancer Neg Hx    Stomach cancer Neg Hx     Social History Social History   Tobacco Use   Smoking status: Some Days    Types: Cigarettes, E-cigarettes    Last attempt to quit: 06/23/2019    Years since quitting: 3.8   Smokeless tobacco: Never  Vaping Use   Vaping Use: Every day   Substances: Nicotine, Flavoring  Substance Use Topics   Alcohol use: Yes    Comment:  2-4 q wk   Drug use: No     Allergies   Lidocaine   Review of Systems Review of Systems  Skin:  Positive for rash.  All other systems reviewed and are negative.    Physical Exam Triage Vital Signs ED Triage Vitals  Enc Vitals Group     BP      Pulse      Resp      Temp      Temp src      SpO2      Weight      Height      Head Circumference      Peak Flow      Pain Score      Pain Loc      Pain Edu?      Excl. in GC?    No data found.  Updated Vital Signs BP 118/79 (BP Location: Left Arm)   Pulse 97   Temp 98.4 F (36.9 C) (Oral)   Resp 16   Ht 5\' 4"  (1.626 m)   Wt 175 lb (79.4 kg)   SpO2 98%   BMI 30.04 kg/m      Physical Exam Vitals and nursing note reviewed.  Constitutional:      Appearance: Normal appearance. She is normal weight.  HENT:     Head: Normocephalic and atraumatic.     Mouth/Throat:     Mouth: Mucous membranes are moist.     Pharynx: Oropharynx is clear.  Eyes:     Extraocular Movements: Extraocular movements intact.     Conjunctiva/sclera: Conjunctivae normal.     Pupils: Pupils are equal, round, and reactive to light.  Cardiovascular:     Rate and Rhythm: Normal rate and regular rhythm.     Pulses: Normal pulses.     Heart sounds: Normal heart sounds.  Pulmonary:     Effort: Pulmonary effort is normal.     Breath sounds: Normal breath sounds. No wheezing, rhonchi or rales.  Musculoskeletal:        General: Normal range of motion.     Cervical back: Normal range of motion and neck supple.  Skin:    General: Skin is warm and dry.     Comments: Left lower arm (volar aspect) warm to touch, erythematous, mildly TTP, pruritic per patient-please see image below  Neurological:     General: No focal deficit present.     Mental Status: She is alert and oriented to person, place, and time. Mental status is at baseline.      UC Treatments / Results  Labs (all labs ordered are listed, but only abnormal results are  displayed) Labs Reviewed - No  data to display  EKG   Radiology No results found.  Procedures Procedures (including critical care time)  Medications Ordered in UC Medications  methylPREDNISolone acetate (DEPO-MEDROL) injection 80 mg (80 mg Intramuscular Given 05/11/23 1531)    Initial Impression / Assessment and Plan / UC Course  I have reviewed the triage vital signs and the nursing notes.  Pertinent labs & imaging results that were available during my care of the patient were reviewed by me and considered in my medical decision making (see chart for details).     MDM: 1.  Cellulitis of arm, left-Rx'd Keflex 1 g twice daily x 7 days; 2.  Bee sting, accidental or unintentional, initial encounter-IM Depo-Medrol 80 mg given once in clinic and prior to discharge, Rx'd EpiPen 2 Pak (0.3 mg / 0.3 mL SOAJinjection).  Patient advised to use autoinjectors in case of anaphylaxis (shortness of breath difficulty breathing increased heart rate from bee sting) Instructed patient to take medication as directed with food to completion.  Encouraged increase daily water intake to 64 ounces per day while taking this medication.  Advised patient for future bee stings may take over-the-counter Allegra 180 mg fexofenadine without D for redness and swelling of surrounding skin.  Advised patient EpiPen 2 pack has been refilled and sent to her pharmacy with medication above.  Advised if symptoms worsen and/or unresolved please follow-up with PCP or here for further evaluation. Final Clinical Impressions(s) / UC Diagnoses   Final diagnoses:  Bee sting, accidental or unintentional, initial encounter  Cellulitis of arm, left     Discharge Instructions      Instructed patient to take medication as directed with food to completion.  Encouraged increase daily water intake to 64 ounces per day while taking this medication.  Advised patient for future bee stings may take over-the-counter Allegra 180 mg  fexofenadine without D for redness and swelling of surrounding skin.  Advised patient EpiPen 2 pack has been refilled and sent to her pharmacy with medication above.  Advised if symptoms worsen and/or unresolved please follow-up with PCP or here for further evaluation.     ED Prescriptions     Medication Sig Dispense Auth. Provider   cephALEXin (KEFLEX) 500 MG capsule Take 2 capsules (1,000 mg total) by mouth 2 (two) times daily for 7 days. 28 capsule Trevor Iha, FNP   EPINEPHrine (EPIPEN 2-PAK) 0.3 mg/0.3 mL IJ SOAJ injection Inject 0.3 mg into the muscle as needed for anaphylaxis. 1 each Trevor Iha, FNP      PDMP not reviewed this encounter.   Trevor Iha, FNP 05/11/23 1544    Trevor Iha, FNP 05/11/23 [id number]

## 2023-05-11 NOTE — ED Triage Notes (Signed)
Rash/cellulitis to left forearm Pt was stung by a bee 3 days ago  Swelling noted  Pt has been using benadryl  at night & topical  No relief - "itches like crazy"

## 2023-05-11 NOTE — Discharge Instructions (Addendum)
Instructed patient to take medication as directed with food to completion.  Encouraged increase daily water intake to 64 ounces per day while taking this medication.  Advised patient for future bee stings may take over-the-counter Allegra 180 mg fexofenadine without D for redness and swelling of surrounding skin.  Advised patient EpiPen 2 pack has been refilled and sent to her pharmacy with medication above.  Advised if symptoms worsen and/or unresolved please follow-up with PCP or here for further evaluation.

## 2023-05-27 ENCOUNTER — Encounter: Payer: Self-pay | Admitting: Sports Medicine

## 2023-05-27 DIAGNOSIS — M51369 Other intervertebral disc degeneration, lumbar region without mention of lumbar back pain or lower extremity pain: Secondary | ICD-10-CM

## 2023-05-27 DIAGNOSIS — M5136 Other intervertebral disc degeneration, lumbar region: Secondary | ICD-10-CM

## 2023-05-27 DIAGNOSIS — M5412 Radiculopathy, cervical region: Secondary | ICD-10-CM

## 2023-05-27 MED ORDER — GABAPENTIN 100 MG PO CAPS: 200.0000 mg | ORAL_CAPSULE | Freq: Every day | ORAL | 3 refills | Status: DC

## 2023-06-07 ENCOUNTER — Other Ambulatory Visit: Payer: Self-pay | Admitting: Sports Medicine

## 2023-06-09 MED ORDER — AMPHETAMINE-DEXTROAMPHETAMINE 20 MG PO TABS: 20.0000 mg | ORAL_TABLET | Freq: Every day | ORAL | 0 refills | Status: DC

## 2023-06-22 ENCOUNTER — Other Ambulatory Visit: Payer: Self-pay | Admitting: Sports Medicine

## 2023-06-22 DIAGNOSIS — E785 Hyperlipidemia, unspecified: Secondary | ICD-10-CM

## 2023-09-09 ENCOUNTER — Encounter: Payer: Self-pay | Admitting: Physician Assistant

## 2023-09-09 ENCOUNTER — Ambulatory Visit: Payer: 59 | Admitting: Physician Assistant

## 2023-09-09 ENCOUNTER — Ambulatory Visit (INDEPENDENT_AMBULATORY_CARE_PROVIDER_SITE_OTHER): Payer: 59

## 2023-09-09 VITALS — BP 116/82 | HR 81 | Ht 64.0 in | Wt 188.0 lb

## 2023-09-09 DIAGNOSIS — M533 Sacrococcygeal disorders, not elsewhere classified: Secondary | ICD-10-CM

## 2023-09-09 MED ORDER — PREDNISONE 50 MG PO TABS
ORAL_TABLET | ORAL | 0 refills | Status: DC
Start: 2023-09-09 — End: 2024-03-21

## 2023-09-09 NOTE — Progress Notes (Signed)
Acute Office Visit  Subjective:     Patient ID: April [name], female    DOB: 1971/10/04, 52 y.o.   MRN: [id number]  Chief Complaint  Patient presents with   Back Pain    Tail bone pain 2 weeks    HPI Patient is in today for 2 weeks of pain over tailbone. She admits to squatting while gardening and then falling over on her buttocks before the pain started. She did not report much pain with the initial fall. She does have lumbar DDD. She get regular deep tissue massages. She has been using ibuprofen which does help some along with trying not to sit with direct pressure. She is using a donut pillow. Constant pain is 1-2 out of 10 pain with sitting is 5-6 out of 10. She denies any radiation of pain down legs. Denies any urinary or bowel dysfunction. She has not had any leg weakness. She denies any pain with bowel movements or blood in stool.   .. Active Ambulatory Problems    Diagnosis Date Noted   Generalized anxiety disorder 03/15/2013   Insomnia 03/15/2013   Annual physical exam 03/15/2013   Actinic keratosis of left cheek 03/17/2013   Cervical radiculitis 05/05/2014   Obesity 06/02/2014   Adult ADHD 11/15/2015   Rosacea 11/15/2015   Current smoker 02/12/2016   Hyperlipidemia LDL goal <100 07/14/2017   Bilateral carpal tunnel syndrome 08/02/2018   Essential hypertension 12/20/2018   GERD (gastroesophageal reflux disease) 03/15/2019   Onychomycosis 08/01/2019   Internal derangement of right knee 08/24/2019   Lumbar degenerative disc disease 12/26/2019   Seborrheic keratosis right side of the nose near the medial canthus 10/08/2020   Hemorrhoids 01/21/2021   Shellfish allergy 11/25/2021   Primary osteoarthritis of first metatarsophalangeal (MTP) joint of right foot 11/18/2022   Coccyx pain 09/09/2023   Resolved Ambulatory Problems    Diagnosis Date Noted   Lumbar radiculitis 05/05/2014   Exposure to chemical inhalation 01/08/2015   Lateral epicondylitis of right elbow  06/28/2015   Cellulitis and abscess of trunk 06/28/2015   Cough 12/12/2015   Hoarseness 06/10/2016   Strain of calf muscle 08/15/2016   Low vitamin D level 08/05/2017   Diarrhea in adult patient 09/17/2017   Ganglion cyst of wrist, left 08/02/2018   Past Medical History:  Diagnosis Date   Anxiety    DDD (degenerative disc disease), cervical    High cholesterol    Pre-diabetes    Vertigo      ROS See HPI.      Objective:    BP 116/82   Pulse 81   Ht 5\' 4"  (1.626 m)   Wt 188 lb (85.3 kg)   SpO2 99%   BMI 32.27 kg/m  BP Readings from Last 3 Encounters:  09/09/23 116/82  05/11/23 118/79  12/08/22 127/82   Wt Readings from Last 3 Encounters:  09/09/23 188 lb (85.3 kg)  05/11/23 175 lb (79.4 kg)  12/08/22 182 lb (82.6 kg)      Physical Exam Constitutional:      Appearance: Normal appearance. She is obese.  HENT:     Head: Normocephalic.  Musculoskeletal:        General: No swelling or tenderness.     Right lower leg: No edema.     Left lower leg: No edema.     Comments: 5/5 lower ext strength Negative straight leg raise, bilaterally.  Pain to palpation over sacrum Tight paraspinal muscles in the buttocks and lower spine.  Neurological:     General: No focal deficit present.     Mental Status: She is alert and oriented to person, place, and time.  Psychiatric:        Mood and Affect: Mood normal.          Assessment & Plan:  Marland KitchenMarland KitchenXiclaly was seen today for back pain.  Diagnoses and all orders for this visit:  Coccyx pain -     DG Sacrum/Coccyx; Future -     predniSONE (DELTASONE) 50 MG tablet; Take one tablet daily for 5 days.   Contusion vs fracture of sacrum Xray done today Prednisone burst given for 5 days Discussed symptomatic care with ice and avoiding sitting to aggravate area It could take time to fully heal Follow up as needed or if symptoms persist  Tandy Gaw, PA-C

## 2023-09-09 NOTE — Progress Notes (Signed)
No fracture or any acute findings seen. If prednisone not significantly helping I suggest a few sessions of pelvic floor physical therapy.

## 2023-09-09 NOTE — Patient Instructions (Signed)
Take prednisone for 5 days.  Get xray  Tailbone Injury  The tailbone is the small bone at the lower end of the spine. The tailbone is also called the coccyx.A tailbone injury may involve stretched ligaments, bruising, or a broken bone/break (fracture). Tailbone injuries can be painful, and some may take a long time to heal. What are the causes? This condition may be caused by: Falling and landing on the tailbone. Repeated strain or friction from sitting for long periods of time. This may include actions such as rowing or bicycling. Childbirth. In some cases, the cause may not be known. What are the signs or symptoms? Symptoms of this condition include: Pain in the tailbone area or lower back, especially when sitting. Pain or difficulty when standing up from a sitting position. Bruising or swelling in the tailbone area. Painful bowel movements. In females, pain during sex. How is this diagnosed? This condition may be diagnosed based on your symptoms and a physical exam. If your health care provider suspects a fracture, you may have additional tests, such as: X-rays. CT scan. MRI. How is this treated? Most tailbone injuries heal on their own in 4-6 weeks. However, recovery time may be longer if there is a fracture. If treatment is needed, it may include: NSAIDs or other over-the-counter medicines to help relieve your pain. Rubber or inflated ring or cushion to take pressure off the tailbone when sitting. Physical therapy. Injection with local anesthesia and steroid medicine. This is done only if the pain does not improve over time with over-the-counter pain medicines. Follow these instructions at home: Activity Rest as told by your health care provider. Do not sit for a long time without moving. Get up to take short walks every 1-2 hours. This will improve blood flow and breathing. Ask for help if you feel weak or unsteady. Wear appropriate padding and sports gear when bicycling or  rowing. This will prevent repeating an injury that is caused by strain or friction. Do exercises as told by your health care provider. Increase your activity as the pain allows. Return to your normal activities as told by your health care provider. Ask your health care provider what activities are safe for you. Managing pain, stiffness, and swelling To help decrease discomfort when sitting: Sit on your rubber or inflated ring or cushion as told by your health care provider. Lean forward when you sit. If directed, put ice on the injured area. To do this: Put ice in a plastic bag. Place a towel between your skin and the bag. Leave the ice on for 20 minutes, 2-3 times per day for the first 1-2 days. If your skin turns bright red, remove the ice right away to prevent skin damage. The risk of skin damage is higher if you cannot feel pain, heat, or cold. If directed, apply heat to the affected area as often as told by your health care provider. Use the heat source that your health care provider recommends, such as a moist heat pack or a heating pad. Place a towel between your skin and the heat source. Leave the heat on for 20-30 minutes. If your skin turns bright red, remove the heat right away to prevent burns. The risk of burns is higher if you cannot feel pain, heat, or cold. General instructions Take over-the-counter and prescription medicines only as told by your health care provider. Your condition or medicine may cause constipation or painful bowel movements. To prevent or treat constipation, you may need to:  Drink enough fluid to keep your urine pale yellow. Take over-the-counter or prescription medicines. Eat foods that are high in fiber, such as beans, whole grains, and fresh fruits and vegetables. Limit foods that are high in fat and processed sugars, such as fried or sweet foods. Contact a health care provider if: Your pain becomes worse or is not controlled with medicine. Your bowel  movements cause a great deal of discomfort. You are unable to have a bowel movement after 4 days. You have pain during sex. Summary A tailbone injury may involve stretched ligaments, bruising, or a broken bone/break (fracture). Tailbone injuries can be painful. Most heal on their own in 4-6 weeks. Treatment may include taking NSAIDs, doing physical therapy, or using a rubber or inflated ring or cushion when sitting. Follow any recommendations from your health care provider to prevent or treat constipation. This information is not intended to replace advice given to you by your health care provider. Make sure you discuss any questions you have with your health care provider. Document Revised: 02/11/2022 Document Reviewed: 02/11/2022 Elsevier Patient Education  2024 [employer].

## 2023-12-15 ENCOUNTER — Telehealth: Payer: Self-pay | Admitting: Sports Medicine

## 2023-12-15 DIAGNOSIS — F411 Generalized anxiety disorder: Secondary | ICD-10-CM

## 2023-12-15 MED ORDER — CITALOPRAM HYDROBROMIDE 20 MG PO TABS: 30.0000 mg | ORAL_TABLET | Freq: Every day | ORAL | 3 refills | Status: DC

## 2023-12-15 NOTE — Telephone Encounter (Signed)
Patient called about cialopram 20mg  patients states she takes 1 1/2 tablets and they gave her 30 pills [facility] [address]  [place] [address] Phone [phone] Please advise

## 2023-12-15 NOTE — Telephone Encounter (Signed)
We will make sure she gets a 90-day supply.

## 2023-12-18 ENCOUNTER — Ambulatory Visit (INDEPENDENT_AMBULATORY_CARE_PROVIDER_SITE_OTHER): Payer: BC Managed Care – PPO | Admitting: Sports Medicine

## 2023-12-18 VITALS — BP 129/87 | HR 81 | Ht 64.0 in | Wt 199.0 lb

## 2023-12-18 DIAGNOSIS — M533 Sacrococcygeal disorders, not elsewhere classified: Secondary | ICD-10-CM

## 2023-12-18 DIAGNOSIS — E785 Hyperlipidemia, unspecified: Secondary | ICD-10-CM

## 2023-12-18 NOTE — Assessment & Plan Note (Signed)
This is a very pleasant 53 year old female, she was here for physical but she would rather discuss a problem with her tailbone, the past several months she has had pain that she localizes deep in the intergluteal cleft directly over her coccyx, she recalls an episode where she lost her balance and rolled backwards falling onto her buttocks, she is not sure if this is when the pain started but is the only traumatic event that she can recall. On exam she has no tenderness over the entire lumbar spine, spinous processes, paralumbar musculature, she does however have discrete tenderness at the coccyx itself. She saw 1 my partners, x-rays were done that were negative, she has been sitting on a donut pillow for over a month now. Due to persistence of discomfort in spite of conservative treatment we will proceed with MRI of the sacrum and coccyx looking for sacral insufficiency fracture, coccygeal edema. If sacral insufficiency fracture is seen she will need to be partial weightbearing. If we see edema in the coccyx or normal coccyx we will try intra coccygeal segment injections.

## 2023-12-18 NOTE — Progress Notes (Signed)
    Procedures performed today:    None.  Independent interpretation of notes and tests performed by another provider:   None.  Brief History, Exam, Impression, and Recommendations:    Coccyx pain This is a very pleasant 53 year old female, she was here for physical but she would rather discuss a problem with her tailbone, the past several months she has had pain that she localizes deep in the intergluteal cleft directly over her coccyx, she recalls an episode where she lost her balance and rolled backwards falling onto her buttocks, she is not sure if this is when the pain started but is the only traumatic event that she can recall. On exam she has no tenderness over the entire lumbar spine, spinous processes, paralumbar musculature, she does however have discrete tenderness at the coccyx itself. She saw 1 my partners, x-rays were done that were negative, she has been sitting on a donut pillow for over a month now. Due to persistence of discomfort in spite of conservative treatment we will proceed with MRI of the sacrum and coccyx looking for sacral insufficiency fracture, coccygeal edema. If sacral insufficiency fracture is seen she will need to be partial weightbearing. If we see edema in the coccyx or normal coccyx we will try intra coccygeal segment injections.    ____________________________________________ Ihor Austin. Benjamin Stain, M.D., ABFM., CAQSM., AME. Primary Care and Sports Medicine [facility]  Adjunct Professor of Family Medicine  [employer]  Restaurant manager, fast food

## 2023-12-22 DIAGNOSIS — E785 Hyperlipidemia, unspecified: Secondary | ICD-10-CM | POA: Diagnosis not present

## 2023-12-23 ENCOUNTER — Encounter: Payer: Self-pay | Admitting: Sports Medicine

## 2023-12-23 LAB — COMPREHENSIVE METABOLIC PANEL
ALT: 19 [IU]/L (ref 0–32)
AST: 16 [IU]/L (ref 0–40)
Albumin: 4.6 g/dL (ref 3.8–4.9)
Alkaline Phosphatase: 79 [IU]/L (ref 44–121)
BUN/Creatinine Ratio: 17 (ref 9–23)
BUN: 12 mg/dL (ref 6–24)
Bilirubin Total: 0.2 mg/dL (ref 0.0–1.2)
CO2: 26 mmol/L (ref 20–29)
Calcium: 9.3 mg/dL (ref 8.7–10.2)
Chloride: 104 mmol/L (ref 96–106)
Creatinine, Ser: 0.69 mg/dL (ref 0.57–1.00)
Globulin, Total: 1.8 g/dL (ref 1.5–4.5)
Glucose: 98 mg/dL (ref 70–99)
Potassium: 4.6 mmol/L (ref 3.5–5.2)
Sodium: 145 mmol/L — ABNORMAL HIGH (ref 134–144)
Total Protein: 6.4 g/dL (ref 6.0–8.5)
eGFR: 103 mL/min/{1.73_m2} (ref >59)

## 2023-12-23 LAB — CBC
Hematocrit: 43.7 % (ref 34.0–46.6)
Hemoglobin: 14.2 g/dL (ref 11.1–15.9)
MCH: 30.1 pg (ref 26.6–33.0)
MCHC: 32.5 g/dL (ref 31.5–35.7)
MCV: 93 fL (ref 79–97)
Platelets: 219 10*3/uL (ref 150–450)
RBC: 4.71 x10E6/uL (ref 3.77–5.28)
RDW: 12.2 % (ref 11.7–15.4)
WBC: 5.9 10*3/uL (ref 3.4–10.8)

## 2023-12-23 LAB — LIPID PANEL
Chol/HDL Ratio: 2.9 {ratio} (ref 0.0–4.4)
Cholesterol, Total: 187 mg/dL (ref 100–199)
HDL: 65 mg/dL (ref >39)
LDL Chol Calc (NIH): 108 mg/dL — ABNORMAL HIGH (ref 0–99)
Triglycerides: 79 mg/dL (ref 0–149)
VLDL Cholesterol Cal: 14 mg/dL (ref 5–40)

## 2023-12-23 LAB — HEMOGLOBIN A1C
Est. average glucose Bld gHb Est-mCnc: 134 mg/dL
Hgb A1c MFr Bld: 6.3 % — ABNORMAL HIGH (ref 4.8–5.6)

## 2023-12-23 LAB — TSH: TSH: 2.02 u[IU]/mL (ref 0.450–4.500)

## 2023-12-28 NOTE — Telephone Encounter (Signed)
 She has had conservative treatment since October, first seen by my partner Rodger Civil October 23 and then by me January 31, this is much longer than 6 weeks.    Please let the nurses looking over this no that she has had over 6 weeks of physician directed conservative treatment.

## 2024-01-29 ENCOUNTER — Other Ambulatory Visit: Payer: Self-pay | Admitting: Sports Medicine

## 2024-02-03 NOTE — Telephone Encounter (Signed)
 Last filled 06/09/2023  Last OV 12/18/2023

## 2024-02-04 MED ORDER — AMPHETAMINE-DEXTROAMPHETAMINE 20 MG PO TABS: 20.0000 mg | ORAL_TABLET | Freq: Every day | ORAL | 0 refills | Status: DC

## 2024-02-15 ENCOUNTER — Other Ambulatory Visit: Payer: Self-pay | Admitting: Sports Medicine

## 2024-02-15 ENCOUNTER — Telehealth: Payer: Self-pay

## 2024-02-15 DIAGNOSIS — Z1231 Encounter for screening mammogram for malignant neoplasm of breast: Secondary | ICD-10-CM

## 2024-02-15 NOTE — Telephone Encounter (Signed)
April [name] has been updated

## 2024-02-15 NOTE — Telephone Encounter (Signed)
 Per [facility] - patient has 2 MRI orders. One of the MRI Berkley Harvey has expired. Requesting a new auth so that patient can complete both MRIs together. Thanks in advance.

## 2024-02-16 ENCOUNTER — Ambulatory Visit (INDEPENDENT_AMBULATORY_CARE_PROVIDER_SITE_OTHER)

## 2024-02-16 ENCOUNTER — Ambulatory Visit

## 2024-02-16 DIAGNOSIS — M48061 Spinal stenosis, lumbar region without neurogenic claudication: Secondary | ICD-10-CM | POA: Diagnosis not present

## 2024-02-16 DIAGNOSIS — M47816 Spondylosis without myelopathy or radiculopathy, lumbar region: Secondary | ICD-10-CM | POA: Diagnosis not present

## 2024-02-16 DIAGNOSIS — M533 Sacrococcygeal disorders, not elsewhere classified: Secondary | ICD-10-CM

## 2024-02-16 DIAGNOSIS — M545 Low back pain, unspecified: Secondary | ICD-10-CM

## 2024-02-16 DIAGNOSIS — M5126 Other intervertebral disc displacement, lumbar region: Secondary | ICD-10-CM | POA: Diagnosis not present

## 2024-02-16 DIAGNOSIS — M47817 Spondylosis without myelopathy or radiculopathy, lumbosacral region: Secondary | ICD-10-CM | POA: Diagnosis not present

## 2024-03-10 ENCOUNTER — Ambulatory Visit

## 2024-03-10 DIAGNOSIS — Z1231 Encounter for screening mammogram for malignant neoplasm of breast: Secondary | ICD-10-CM | POA: Diagnosis not present

## 2024-03-21 ENCOUNTER — Ambulatory Visit: Admitting: Sports Medicine

## 2024-03-21 DIAGNOSIS — M533 Sacrococcygeal disorders, not elsewhere classified: Secondary | ICD-10-CM | POA: Diagnosis not present

## 2024-03-21 NOTE — Assessment & Plan Note (Addendum)
 This very pleasant 53 year old female returns, we recently diagnosed her back in the beginning of the year with coccydynia, she recalls an episode where she lost her balance and rolled backwards falling onto her buttocks, that was the only traumatic event she could recall, she had no tenderness anywhere in the lumbar spine, spinous processes, paralumbar musculature however she did have discrete and concordant pain over the coccyx itself. X-rays were negative, we added a donut pillow for many months, she was resistant to medications. We obtained an MRI that was completely normal. 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 She will continue donut pillows, adding advanced home conditioning. We can look at this again as needed.

## 2024-03-21 NOTE — Progress Notes (Signed)
    Procedures performed today:    None.  Independent interpretation of notes and tests performed by another provider:   None.  Brief History, Exam, Impression, and Recommendations:    Coccyx pain This very pleasant 53 year old female returns, we recently diagnosed her back in the beginning of the year with coccydynia, she recalls an episode where she lost her balance and rolled backwards falling onto her buttocks, that was the only traumatic event she could recall, she had no tenderness anywhere in the lumbar spine, spinous processes, paralumbar musculature however she did have discrete and concordant pain over the coccyx itself. X-rays were negative, we added a donut pillow for many months, she was resistant to medications. We obtained an MRI that was completely normal. 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 She will continue donut pillows, adding advanced home conditioning. We can look at this again as needed.    ____________________________________________ Joselyn Nicely. Sandy Crumb, M.D., ABFM., CAQSM., AME. Primary Care and Sports Medicine [facility]  Adjunct Professor of [facility] Medicine  [employer]  Restaurant manager, fast food

## 2024-05-30 ENCOUNTER — Other Ambulatory Visit: Payer: Self-pay | Admitting: Sports Medicine

## 2024-05-30 DIAGNOSIS — E785 Hyperlipidemia, unspecified: Secondary | ICD-10-CM

## 2024-06-15 ENCOUNTER — Ambulatory Visit: Admitting: Sports Medicine

## 2024-06-15 ENCOUNTER — Encounter: Payer: Self-pay | Admitting: Sports Medicine

## 2024-06-28 ENCOUNTER — Encounter: Payer: Self-pay | Admitting: Urgent Care

## 2024-06-28 ENCOUNTER — Ambulatory Visit: Admitting: Urgent Care

## 2024-06-28 VITALS — BP 132/79 | HR 80 | Resp 18 | Ht 64.0 in | Wt 195.2 lb

## 2024-06-28 DIAGNOSIS — U071 COVID-19: Secondary | ICD-10-CM | POA: Diagnosis not present

## 2024-06-28 DIAGNOSIS — R430 Anosmia: Secondary | ICD-10-CM | POA: Diagnosis not present

## 2024-06-28 DIAGNOSIS — R432 Parageusia: Secondary | ICD-10-CM

## 2024-06-28 LAB — POC COVID19/FLU A&B COMBO
Covid Antigen, POC: POSITIVE — AB
Influenza A Antigen, POC: NEGATIVE
Influenza B Antigen, POC: NEGATIVE

## 2024-06-28 MED ORDER — NIRMATRELVIR/RITONAVIR (PAXLOVID)TABLET: 3.0000 | ORAL_TABLET | Freq: Two times a day (BID) | ORAL | 0 refills | Status: AC

## 2024-06-28 NOTE — Patient Instructions (Signed)
 You are positive for covid.  Please read the attached information regarding covid and paxlovid . Take paxlovid  3 pills twice daily for 5 days. Common side effects include diarrhea or nausea and mild headache.  Please rest and drink plenty of water. You can purchased over the counter oscillococcinum - this helps with body aches. Over the counter Quercetin with zinc helps boost your natural immune system to fight off the virus.

## 2024-06-28 NOTE — Progress Notes (Signed)
 Established Patient Office Visit  Subjective:  Patient ID: April [name], female    DOB: 11-Oct-1971  Age: 53 y.o. MRN: [id number]  Chief Complaint  Patient presents with   Sinusitis    53yo female presents today for complaint of headache, sinus pain, sore throat, cough, and anosmia. Sx started out as sore throat on Friday. Sore throat has actually improved and nearly resolved, but now having sneezing, sinus pain, congestion, dry cough and headache. She also notes blurred vision, but states she needs new glasses and can see fine through the bifocal portion. Hx of pre-diabetes, last A1C checked in Feb. She denies fever, SOB, DOE or CP. Has been taking numerous OTC meds. 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    Patient Active Problem List   Diagnosis Date Noted   Coccyx pain 09/09/2023   Primary osteoarthritis of first metatarsophalangeal (MTP) joint of right foot 11/18/2022   Shellfish allergy 11/25/2021   Hemorrhoids 01/21/2021   Seborrheic keratosis right side of the nose near the medial canthus 10/08/2020   Lumbar degenerative disc disease 12/26/2019   Internal derangement of right knee 08/24/2019   Onychomycosis 08/01/2019   GERD (gastroesophageal reflux disease) 03/15/2019   Essential hypertension 12/20/2018   Bilateral carpal tunnel syndrome 08/02/2018   Other dysphagia 09/24/2017   S/P cervical spinal fusion 09/24/2017   Postoperative follow-up 09/02/2017   Cervical herniated disc 08/03/2017   Hyperlipidemia LDL goal <100 07/14/2017   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, right 04/15/2017   Current smoker 02/12/2016   Adult ADHD 11/15/2015   Rosacea 11/15/2015   Obesity 06/02/2014   Cervical radiculitis 05/05/2014   Actinic keratosis of left cheek 03/17/2013   Generalized anxiety disorder 03/15/2013   Insomnia 03/15/2013   Annual physical exam 03/15/2013   Past Medical History:  Diagnosis Date   Anxiety    DDD  (degenerative disc disease), cervical    Hemorrhoids    High cholesterol    Pre-diabetes    Vertigo    Past Surgical History:  Procedure Laterality Date   ABLATION     CESAREAN SECTION     x2   CHOLECYSTECTOMY     HEMORRHOID SURGERY     x 5- thrombosed hemorroids   SPINAL FUSION     cervical spine C3-5   TUBAL LIGATION     Social History   Tobacco Use   Smoking status: Some Days    Current packs/day: 0.00    Types: Cigarettes, E-cigarettes    Last attempt to quit: 06/23/2019    Years since quitting: 5.0   Smokeless tobacco: Never  Vaping Use   Vaping status: Every Day   Substances: Nicotine, Flavoring  Substance Use Topics   Alcohol use: Yes    Comment: 2-4 q wk   Drug use: No      ROS: as noted in HPI  Objective:     BP 132/79   Pulse 80   Resp 18   Ht 5' 4 (1.626 m)   Wt 195 lb 4 oz (88.6 kg)   SpO2 100%   BMI 33.51 kg/m  BP Readings from Last 3 Encounters:  06/28/24 132/79  12/18/23 129/87  09/09/23 116/82   Wt Readings from Last 3 Encounters:  06/28/24 195 lb 4 oz (88.6 kg)  12/18/23 199 lb (90.3 kg)  09/09/23 188 lb (85.3 kg)      Physical Exam Vitals and nursing note reviewed.  Constitutional:  General: She is not in acute distress.    Appearance: Normal appearance. She is not ill-appearing, toxic-appearing or diaphoretic.  HENT:     Head: Normocephalic and atraumatic.     Right Ear: Ear canal and external ear normal. No drainage, swelling or tenderness. No middle ear effusion. There is no impacted cerumen. Tympanic membrane is not injected, scarred, perforated or erythematous.     Left Ear: Ear canal and external ear normal. No drainage, swelling or tenderness.  No middle ear effusion. There is no impacted cerumen. Tympanic membrane is not injected, scarred, perforated or erythematous.     Nose: Congestion and rhinorrhea present. Rhinorrhea is purulent.     Right Turbinates: Enlarged and swollen.     Left Turbinates: Enlarged and  swollen.     Right Sinus: No maxillary sinus tenderness or frontal sinus tenderness.     Left Sinus: No maxillary sinus tenderness or frontal sinus tenderness.     Mouth/Throat:     Lips: Pink.     Mouth: Mucous membranes are moist.     Pharynx: Oropharynx is clear. Uvula midline. No pharyngeal swelling, oropharyngeal exudate, posterior oropharyngeal erythema, uvula swelling or postnasal drip.  Cardiovascular:     Rate and Rhythm: Normal rate and regular rhythm.  Pulmonary:     Effort: Pulmonary effort is normal. No respiratory distress.     Breath sounds: Normal breath sounds. No stridor. No wheezing, rhonchi or rales.  Musculoskeletal:     Cervical back: Normal range of motion and neck supple. No rigidity or tenderness.  Lymphadenopathy:     Cervical: No cervical adenopathy.  Skin:    General: Skin is warm and dry.     Coloration: Skin is not jaundiced.     Findings: No bruising, erythema or rash.  Neurological:     General: No focal deficit present.     Mental Status: She is alert and oriented to person, place, and time.     Motor: No weakness.     Gait: Gait normal.  Psychiatric:        Mood and Affect: Mood normal.        Behavior: Behavior normal.      Results for orders placed or performed in visit on 06/28/24  POC Covid19/Flu A&B Antigen  Result Value Ref Range   Influenza A Antigen, POC Negative Negative   Influenza B Antigen, POC Negative Negative   Covid Antigen, POC Positive (A) Negative    Last CBC Lab Results  Component Value Date   WBC 5.9 12/22/2023   HGB 14.2 12/22/2023   HCT 43.7 12/22/2023   MCV 93 12/22/2023   MCH 30.1 12/22/2023   RDW 12.2 12/22/2023   PLT 219 12/22/2023   Last metabolic panel Lab Results  Component Value Date   GLUCOSE 98 12/22/2023   NA 145 (H) 12/22/2023   K 4.6 12/22/2023   CL 104 12/22/2023   CO2 26 12/22/2023   BUN 12 12/22/2023   CREATININE 0.69 12/22/2023   EGFR 103 12/22/2023   CALCIUM  9.3 12/22/2023   PROT  6.4 12/22/2023   ALBUMIN 4.6 12/22/2023   LABGLOB 1.8 12/22/2023   BILITOT 0.2 12/22/2023   ALKPHOS 79 12/22/2023   AST 16 12/22/2023   ALT 19 12/22/2023   ANIONGAP 14 06/22/2020      The 10-year ASCVD risk score (Arnett DK, et al., 2019) is: 3.8%  Assessment & Plan:  Loss of taste -     POC Covid19/Flu A&B Antigen  Loss  of sense of smell -     POC Covid19/Flu A&B Antigen  COVID-19 -     nirmatrelvir /ritonavir ; Take 3 tablets by mouth 2 (two) times daily for 5 days. (Take nirmatrelvir  150 mg two tablets twice daily for 5 days and ritonavir  100 mg one tablet twice daily for 5 days) Patient GFR is 103  Dispense: 30 tablet; Refill: 0  Pt is on day 5 of sx onset, therefore must start tx today.  Discussed risks, benefits and side effects to paxlovid . Last renal function test in Feb WNL. Also recommended otc meds such a quercetin with zinc to help with immune system. Pt has follow up with PCP in two days. Will defer A1C testing and vision screening to PCP.    No follow-ups on file.   Benton LITTIE Gave, PA

## 2024-06-30 ENCOUNTER — Ambulatory Visit: Admitting: Sports Medicine

## 2024-06-30 DIAGNOSIS — M5136 Other intervertebral disc degeneration, lumbar region with discogenic back pain only: Secondary | ICD-10-CM

## 2024-06-30 MED ORDER — PREDNISONE 50 MG PO TABS: ORAL_TABLET | ORAL | 0 refills | Status: DC

## 2024-06-30 NOTE — Assessment & Plan Note (Addendum)
 This very pleasant 72 a female returns, she has had increasing left-sided axial low back pain, particularly when standing. Earlier this year we did an SI joint as well as an L-spine MRI, the SI joint MRI was normal without any edema around the SI joints or the coccyx. Lumbar spine MRI did show desiccation and mild disc height loss and bulging of the L5-S1 disc. Today she comes in with pain that she localizes left posterior low back over the buttock, without radiation past the knee. On exam she has some tenderness at this location Her hip exam is completely normal. Unclear as to whether this is a SI joint pain generator or lumbar spine pain generator, we will treat her for both, 5 days of prednisone , home PT for the SI joints and the lumbar spine. When she returns we will likely inject her SI joint if the pain declares itself as coming from this location.

## 2024-06-30 NOTE — Progress Notes (Signed)
    Procedures performed today:    None.  Independent interpretation of notes and tests performed by another provider:   None.  Brief History, Exam, Impression, and Recommendations:    Lumbar degenerative disc disease This very pleasant 24 a female returns, she has had increasing left-sided axial low back pain, particularly when standing. Earlier this year we did an SI joint as well as an L-spine MRI, the SI joint MRI was normal without any edema around the SI joints or the coccyx. Lumbar spine MRI did show desiccation and mild disc height loss and bulging of the L5-S1 disc. Today she comes in with pain that she localizes left posterior low back over the buttock, without radiation past the knee. On exam she has some tenderness at this location Her hip exam is completely normal. Unclear as to whether this is a SI joint pain generator or lumbar spine pain generator, we will treat her for both, 5 days of prednisone , home PT for the SI joints and the lumbar spine. When she returns we will likely inject her SI joint if the pain declares itself as coming from this location.    ____________________________________________ April [name], M.D., ABFM., CAQSM., AME. Primary Care and Sports Medicine [facility]  Adjunct Professor of [facility] Medicine  [employer]  Restaurant manager, fast food

## 2024-07-19 ENCOUNTER — Encounter: Payer: Self-pay | Admitting: Sports Medicine

## 2024-10-15 ENCOUNTER — Encounter: Payer: Self-pay | Admitting: Gastroenterology

## 2024-11-23 ENCOUNTER — Telehealth: Payer: Self-pay | Admitting: Physician Assistant

## 2024-11-23 NOTE — Telephone Encounter (Signed)
 Copied from CRM #[id number]. Topic: Appointments - Scheduling Inquiry for Clinic >> Nov 23, 2024 10:49 AM April [name] wrote: Reason for CRM:  Patient  husband April [name]  spoke to [facility] PA who stated that she would accept April [name] as a patient. April [name] would like to schedule an appt with April Bologna PA.

## 2024-11-24 ENCOUNTER — Telehealth: Payer: Self-pay

## 2024-11-24 NOTE — Telephone Encounter (Signed)
 Patient called office to see if she could est care with [facility], patients spouse is a patient of Jade;'s, please advise,thanks.

## 2024-11-28 ENCOUNTER — Encounter: Admitting: Urgent Care

## 2024-11-30 ENCOUNTER — Encounter: Payer: Self-pay | Admitting: Physician Assistant

## 2024-11-30 ENCOUNTER — Ambulatory Visit: Admitting: Physician Assistant

## 2024-11-30 VITALS — BP 125/69 | HR 74 | Ht 64.0 in | Wt 195.0 lb

## 2024-11-30 DIAGNOSIS — E66811 Obesity, class 1: Secondary | ICD-10-CM

## 2024-11-30 DIAGNOSIS — R7303 Prediabetes: Secondary | ICD-10-CM | POA: Insufficient documentation

## 2024-11-30 DIAGNOSIS — I10 Essential (primary) hypertension: Secondary | ICD-10-CM

## 2024-11-30 DIAGNOSIS — E785 Hyperlipidemia, unspecified: Secondary | ICD-10-CM | POA: Diagnosis not present

## 2024-11-30 DIAGNOSIS — Z7689 Persons encountering health services in other specified circumstances: Secondary | ICD-10-CM

## 2024-11-30 DIAGNOSIS — F411 Generalized anxiety disorder: Secondary | ICD-10-CM | POA: Diagnosis not present

## 2024-11-30 DIAGNOSIS — E6609 Other obesity due to excess calories: Secondary | ICD-10-CM

## 2024-11-30 DIAGNOSIS — M205X1 Other deformities of toe(s) (acquired), right foot: Secondary | ICD-10-CM | POA: Insufficient documentation

## 2024-11-30 DIAGNOSIS — Z6833 Body mass index (BMI) 33.0-33.9, adult: Secondary | ICD-10-CM

## 2024-11-30 DIAGNOSIS — F330 Major depressive disorder, recurrent, mild: Secondary | ICD-10-CM | POA: Diagnosis not present

## 2024-11-30 DIAGNOSIS — Z1159 Encounter for screening for other viral diseases: Secondary | ICD-10-CM | POA: Diagnosis not present

## 2024-11-30 MED ORDER — CITALOPRAM HYDROBROMIDE 40 MG PO TABS: 40.0000 mg | ORAL_TABLET | Freq: Every day | ORAL | 3 refills | Status: AC

## 2024-11-30 NOTE — Patient Instructions (Signed)
 Obesity, Adult Obesity is having too much body fat. Being obese means that your weight is more than what is healthy for you.  BMI (body mass index) is a number that explains how much body fat you have. If you have a BMI of 30 or more, you are obese. Obesity can cause serious health problems, such as: Stroke. Coronary artery disease (CAD). Type 2 diabetes. Some types of cancer. High blood pressure (hypertension). High cholesterol. Gallbladder stones. Obesity can also contribute to: Osteoarthritis. Sleep apnea. Infertility problems. What are the causes? Eating meals each day that are high in calories, sugar, and fat. Drinking a lot of drinks that have sugar in them. Being born with genes that may make you more likely to become obese. Having a medical condition that causes obesity. Taking certain medicines. Sitting a lot (having a sedentary lifestyle). Not getting enough sleep. What increases the risk? Having a family history of obesity. Living in an area with limited access to: [place], recreation centers, or sidewalks. Healthy food choices, such as grocery stores and farmers' markets. What are the signs or symptoms? The main sign is having too much body fat. How is this treated? Treatment for this condition often includes changing your lifestyle. Treatment may include: Changing your diet. This may include making a healthy meal plan. Exercise. This may include activity that causes your heart to beat faster (aerobic exercise) and strength training. Work with your doctor to design a program that works for you. Medicine to help you lose weight. This may be used if you are not able to lose one pound a week after 6 weeks of healthy eating and more exercise. Treating conditions that cause the obesity. Surgery. Options may include gastric banding and gastric bypass. This may be done if: Other treatments have not helped to improve your condition. You have a BMI of 40 or higher. You have  life-threatening health problems related to obesity. Follow these instructions at home: Eating and drinking  Follow advice from your doctor about what to eat and drink. Your doctor may tell you to: Limit fast food, sweets, and processed snack foods. Choose low-fat options. For example, choose low-fat milk instead of whole milk. Eat five or more servings of fruits or vegetables each day. Eat at home more often. This gives you more control over what you eat. Choose healthy foods when you eat out. Learn to read food labels. This will help you learn how much food is in one serving. Keep low-fat snacks available. Avoid drinks that have a lot of sugar in them. These include soda, fruit juice, iced tea with sugar, and flavored milk. Drink enough water to keep your pee (urine) pale yellow. Do not go on fad diets. Physical activity Exercise often, as told by your doctor. Most adults should get up to 150 minutes of moderate-intensity exercise every week.Ask your doctor: What types of exercise are safe for you. How often you should exercise. Warm up and stretch before being active. Do slow stretching after being active (cool down). Rest between times of being active. Lifestyle Work with your doctor and a food expert (dietitian) to set a weight-loss goal that is best for you. Limit your screen time. Find ways to reward yourself that do not involve food. Do not drink alcohol if: Your doctor tells you not to drink. You are pregnant, may be pregnant, or are planning to become pregnant. If you drink alcohol: Limit how much you have to: 0-1 drink a day for women. 0-2 drinks  a day for men. Know how much alcohol is in your drink. In the [place] one drink equals one 12 oz bottle of beer (355 mL), one 5 oz glass of wine (148 mL), or one 1 oz glass of hard liquor (44 mL). General instructions Keep a weight-loss journal. This can help you keep track of: The food that you eat. How much exercise you  get. Take over-the-counter and prescription medicines only as told by your doctor. Take vitamins and supplements only as told by your doctor. Think about joining a support group. Pay attention to your mental health as obesity can lead to depression or self esteem issues. Keep all follow-up visits. Contact a doctor if: You cannot meet your weight-loss goal after you have changed your diet and lifestyle for 6 weeks. You are having trouble breathing. Summary Obesity is having too much body fat. Being obese means that your weight is more than what is healthy for you. Work with your doctor to set a weight-loss goal. Get regular exercise as told by your doctor. This information is not intended to replace advice given to you by your health care provider. Make sure you discuss any questions you have with your health care provider. Document Revised: 06/11/2021 Document Reviewed: 06/11/2021 Elsevier Patient Education  2024 [employer].

## 2024-11-30 NOTE — Progress Notes (Signed)
 "  New Patient Office Visit  Subjective    Patient ID: April [name], female    DOB: [birth year]  Age: 54 y.o. MRN: [id number]  CC:  Chief Complaint  Patient presents with   Establish Care    HPI Pt is a 54 yo obese female who presents to the clinic to establish care. She is frustrated with weight but she is trying and lost 5lbs this week. She needs refills of celexa  and lipitor. She does feel like she is more anxious and down. She works a lot. She does not exercise like she should. She has a right pinky toe that rolls in shoes and would like it looked at today.   Outpatient Encounter Medications as of 11/30/2024  Medication Sig   citalopram  (CELEXA ) 40 MG tablet Take 1 tablet (40 mg total) by mouth daily.   EPINEPHrine  (EPIPEN  2-PAK) 0.3 mg/0.3 mL IJ SOAJ injection Inject 0.3 mg into the muscle as needed for anaphylaxis.   Melatonin 3 MG CAPS Take 1 capsule by mouth at bedtime.   [DISCONTINUED] ALPRAZolam  (XANAX ) 0.25 MG tablet Take 1 tablet (0.25 mg total) by mouth at bedtime as needed for anxiety.   [DISCONTINUED] atorvastatin  (LIPITOR) 10 MG tablet TAKE 1/2 TABLET BY MOUTH DAILY (Patient not taking: Reported on 06/28/2024)   [DISCONTINUED] citalopram  (CELEXA ) 20 MG tablet Take 1.5 tablets (30 mg total) by mouth daily. (Patient not taking: Reported on 06/28/2024)   [DISCONTINUED] gabapentin  (NEURONTIN ) 100 MG capsule Take 2 capsules (200 mg total) by mouth at bedtime. (Patient not taking: Reported on 06/28/2024)   [DISCONTINUED] predniSONE  (DELTASONE ) 50 MG tablet One tab PO daily for 5 days.   [DISCONTINUED] Semaglutide , 2 MG/DOSE, 8 MG/3ML SOPN Semaglutide  2.5 mg/mL + VIt B6 10mg /mL.  Inject 10u/0.1mL/0.25 mg subcu weekly x4 weeks then 20u/0.2mL/0.5 mg subcu weekly x4 weeks, then 40u/0.4mL/1 mg subcu weekly x4 weeks then 68u/0.17mL/1.7mg  subcu weekly x4 weeks then 100u/37mL/2.5mg  subcu weekly. (Patient not taking: Reported on 03/21/2024)   No facility-administered encounter medications on  file as of 11/30/2024.    Past Medical History:  Diagnosis Date   Anxiety    DDD (degenerative disc disease), cervical    Hemorrhoids    High cholesterol    Pre-diabetes    Vertigo     Past Surgical History:  Procedure Laterality Date   ABLATION     CESAREAN SECTION     x2   CHOLECYSTECTOMY     HEMORRHOID SURGERY     x 5- thrombosed hemorroids   SPINAL FUSION     cervical spine C3-5   TUBAL LIGATION      Family History  Problem Relation Age of Onset   Cancer Maternal Aunt    Colon cancer Neg Hx    Colon polyps Neg Hx    Esophageal cancer Neg Hx    Rectal cancer Neg Hx    Stomach cancer Neg Hx     Social History   Socioeconomic History   Marital status: Married    Spouse name: Not on file   Number of children: Not on file   Years of education: Not on file   Highest education level: Not on file  Occupational History   Not on file  Tobacco Use   Smoking status: Some Days    Current packs/day: 0.00    Types: Cigarettes, E-cigarettes    Last attempt to quit: 06/23/2019    Years since quitting: 5.4   Smokeless tobacco: Never  Vaping Use   Vaping status: Every  Day   Substances: Nicotine, Flavoring  Substance and Sexual Activity   Alcohol use: Yes    Comment: 2-4 q wk   Drug use: No   Sexual activity: Yes  Other Topics Concern   Not on file  Social History Narrative   Not on file   Social Drivers of Health   Tobacco Use: High Risk (11/30/2024)   Patient History    Smoking Tobacco Use: Some Days    Smokeless Tobacco Use: Never    Passive Exposure: Not on file  Financial Resource Strain: Not on file  Food Insecurity: Not on file  Transportation Needs: Not on file  Physical Activity: Not on file  Stress: Not on file  Social Connections: Not on file  Intimate Partner Violence: Not on file  Depression (PHQ2-9): High Risk (11/30/2024)   Depression (PHQ2-9)    PHQ-2 Score: 14  Alcohol Screen: Not on file  Housing: Not on file  Utilities: Not on file   Health Literacy: Not on file    ROS See HPI.  Objective    BP 125/69   Pulse 74   Ht 5' 4 (1.626 m)   Wt 195 lb (88.5 kg)   SpO2 99%   BMI 33.47 kg/m  .SABRA    11/30/2024   11:22 AM 12/18/2023   10:55 AM 11/25/2021    8:53 AM 10/08/2020   11:06 AM 08/01/2019   10:27 AM  Depression screen PHQ 2/9  Decreased Interest 0 0 1 0 1  Down, Depressed, Hopeless 2 0 0 0 1  PHQ - 2 Score 2 0 1 0 2  Altered sleeping 2    3  Tired, decreased energy 2    1  Change in appetite 2    2  Feeling bad or failure about yourself  2    1  Trouble concentrating 1    3  Moving slowly or fidgety/restless 1    0  Suicidal thoughts 2    0  PHQ-9 Score 14    12   Difficult doing work/chores Not difficult at all    Somewhat difficult     Data saved with a previous flowsheet row definition   .SABRA    11/30/2024   11:23 AM 08/01/2019   10:27 AM 08/02/2018    2:31 PM  GAD 7 : Generalized Anxiety Score  Nervous, Anxious, on Edge 0 1 2  Control/stop worrying 2 2 2   Worry too much - different things 2 2 2   Trouble relaxing 2 3 2   Restless 1 2 2   Easily annoyed or irritable 1 2 2   Afraid - awful might happen 2 2 2   Total GAD 7 Score 10 14 14   Anxiety Difficulty Not difficult at all Somewhat difficult Somewhat difficult     Physical Exam Constitutional:      Appearance: Normal appearance. She is obese.  HENT:     Head: Normocephalic.  Cardiovascular:     Rate and Rhythm: Normal rate and regular rhythm.  Pulmonary:     Effort: Pulmonary effort is normal.     Breath sounds: Normal breath sounds.  Musculoskeletal:     Comments: Right pinky toe erythematous dorsally. Slight deviation under 4th metatarsal. No tenderness to palpation. No swelling or warmth to touch.   Neurological:     General: No focal deficit present.     Mental Status: She is alert and oriented to person, place, and time.  Psychiatric:        Mood  and Affect: Mood normal.       Assessment & Plan:  SABRASABRAZakyla was seen today for  establish care.  Diagnoses and all orders for this visit:  Transfer requested for continuity of care  Prediabetes -     CMP14+EGFR -     Hemoglobin A1c  Hyperlipidemia LDL goal <100 -     CMP14+EGFR -     Lipid panel  Generalized anxiety disorder -     citalopram  (CELEXA ) 40 MG tablet; Take 1 tablet (40 mg total) by mouth daily. -     CMP14+EGFR  Essential hypertension -     CMP14+EGFR  Mild episode of recurrent major depressive disorder -     citalopram  (CELEXA ) 40 MG tablet; Take 1 tablet (40 mg total) by mouth daily.  Class 1 obesity due to excess calories without serious comorbidity with body mass index (BMI) of 33.0 to 33.9 in adult -     CMP14+EGFR -     Lipid panel -     TSH + free T4 -     VITAMIN D  25 Hydroxy (Vit-D Deficiency, Fractures) -     CBC w/Diff/Platelet  Encounter for hepatitis C screening test for low risk patient -     Hepatitis C Antibody   MDD/GAD PHQ and GAD not to goal Increased celexa  to 40mg  daily Follow up as needed  .SABRADiscussed low carb diet with 1500 calories and 80g of protein.  Exercising at least 150 minutes a week.  [employer] could be a chief technology officer.  Discussed weight loss medications. Would like to continue to work on weight loss on her own.   Fasting labs ordered today.  Will refill statin after lipid level is reviewed.   Right pinky toe growing under 4th metatarsal(curly toe) - discussed gel spacers to wear - follow up as needed or if becomes painful  Return in about 1 year (around 11/30/2025) for Follow up/ sooner if wants to work closer on weight.   Darlene Brozowski, PA-C   "

## 2024-12-01 LAB — CBC WITH DIFFERENTIAL/PLATELET
Basophils Absolute: 0 x10E3/uL (ref 0.0–0.2)
Basos: 1 %
EOS (ABSOLUTE): 0.1 x10E3/uL (ref 0.0–0.4)
Eos: 2 %
Hematocrit: 42.7 % (ref 34.0–46.6)
Hemoglobin: 13.9 g/dL (ref 11.1–15.9)
Immature Grans (Abs): 0 x10E3/uL (ref 0.0–0.1)
Immature Granulocytes: 0 %
Lymphocytes Absolute: 1.3 x10E3/uL (ref 0.7–3.1)
Lymphs: 23 %
MCH: 30 pg (ref 26.6–33.0)
MCHC: 32.6 g/dL (ref 31.5–35.7)
MCV: 92 fL (ref 79–97)
Monocytes Absolute: 0.4 x10E3/uL (ref 0.1–0.9)
Monocytes: 7 %
Neutrophils Absolute: 3.8 x10E3/uL (ref 1.4–7.0)
Neutrophils: 67 %
Platelets: 240 x10E3/uL (ref 150–450)
RBC: 4.64 x10E6/uL (ref 3.77–5.28)
RDW: 12.6 % (ref 11.7–15.4)
WBC: 5.5 x10E3/uL (ref 3.4–10.8)

## 2024-12-01 LAB — CMP14+EGFR
ALT: 23 IU/L (ref 0–32)
AST: 24 IU/L (ref 0–40)
Albumin: 4.8 g/dL (ref 3.8–4.9)
Alkaline Phosphatase: 77 IU/L (ref 49–135)
BUN/Creatinine Ratio: 22 (ref 9–23)
BUN: 17 mg/dL (ref 6–24)
Bilirubin Total: 0.3 mg/dL (ref 0.0–1.2)
CO2: 25 mmol/L (ref 20–29)
Calcium: 9.7 mg/dL (ref 8.7–10.2)
Chloride: 101 mmol/L (ref 96–106)
Creatinine, Ser: 0.77 mg/dL (ref 0.57–1.00)
Globulin, Total: 2.3 g/dL (ref 1.5–4.5)
Glucose: 88 mg/dL (ref 70–99)
Potassium: 4.7 mmol/L (ref 3.5–5.2)
Sodium: 140 mmol/L (ref 134–144)
Total Protein: 7.1 g/dL (ref 6.0–8.5)
eGFR: 91 mL/min/1.73

## 2024-12-01 LAB — TSH+FREE T4
Free T4: 0.93 ng/dL (ref 0.82–1.77)
TSH: 1.4 u[IU]/mL (ref 0.450–4.500)

## 2024-12-01 LAB — LIPID PANEL
Chol/HDL Ratio: 2.8 ratio (ref 0.0–4.4)
Cholesterol, Total: 179 mg/dL (ref 100–199)
HDL: 63 mg/dL
LDL Chol Calc (NIH): 100 mg/dL — ABNORMAL HIGH (ref 0–99)
Triglycerides: 89 mg/dL (ref 0–149)
VLDL Cholesterol Cal: 16 mg/dL (ref 5–40)

## 2024-12-01 LAB — HEMOGLOBIN A1C
Est. average glucose Bld gHb Est-mCnc: 120 mg/dL
Hgb A1c MFr Bld: 5.8 % — ABNORMAL HIGH (ref 4.8–5.6)

## 2024-12-01 LAB — VITAMIN D 25 HYDROXY (VIT D DEFICIENCY, FRACTURES): Vit D, 25-Hydroxy: 32.9 ng/mL (ref 30.0–100.0)

## 2024-12-01 LAB — HEPATITIS C ANTIBODY: Hep C Virus Ab: NONREACTIVE

## 2024-12-02 ENCOUNTER — Ambulatory Visit: Payer: Self-pay | Admitting: Family Medicine

## 2024-12-02 NOTE — Progress Notes (Signed)
 Katheryn, I am covering work for [employer] while she is out today LDL looks a little better this year so great job on that.  A1c is also improved and down to 5.8 which is absolutely fantastic.  You are negative for hepatitis C.  Metabolic panel including liver and kidney function is stable.  And thyroid  level looks great.  Vitamin D  is still on the low end of normal so I still would recommend 25 mcg daily especially through the fall, winter, and early spring months.  Blood count is normal so no sign of anemia or infection.

## 2024-12-02 NOTE — Progress Notes (Signed)
 Last read by Katheryn Lamer at 11:00AM on 12/02/2024.

## 2024-12-08 ENCOUNTER — Ambulatory Visit: Payer: Self-pay

## 2024-12-08 NOTE — Telephone Encounter (Signed)
 FYI Only or Action Required?: FYI only for provider: appointment scheduled on 1/23.  Patient was last seen in primary care on 11/30/2024 by Antoniette Vermell CROME, PA-C.  Called Nurse Triage reporting Sore Throat and Cough.  Symptoms began several days ago.  Interventions attempted: Rest, hydration, or home remedies.  Symptoms are: gradually worsening.  Triage Disposition: See Physician Within 24 Hours  Patient/caregiver understands and will follow disposition?: Yes  Message from Mercer PEDLAR sent at 12/08/2024 10:03 AM EST  Reason for Triage: Productive cough, sore throat.   Reason for Disposition  Earache also present  Answer Assessment - Initial Assessment Questions Weds after physical with Jade- started sore throat  Thurs-Sat- hoarse voice with sore throat- lost voice this weekend- at a concert and strained voice. Saturday- covid/flu/ rsv test was negative  Sun/mon no voice and cough and congestion developed.  Cough was intermittant and now persistent with sinus congestion and bilateral ear aches  Ear pain- bilateral - got drainage out of Right ear yesterday brown  Covid- always gets ear pain so took test  and was negative  Appt with pcp office to assess 1/23- ED/UC precautions reviewed and understood. Will try Mucinex  and hot tea/lozenges/cough drops  1. ONSET: When did the throat start hurting? (Hours or days ago)      Weds started sore throat  2. SEVERITY: How bad is the sore throat? (Scale 1-10; mild, moderate or severe)     Pain max 7/10 and today is 4/10  3. STREP EXPOSURE: Has there been any exposure to strep within the past week? If Yes, ask: What type of contact occurred?      Unsure  4.  VIRAL SYMPTOMS: Are there any symptoms of a cold, such as a runny nose, cough, hoarse voice or red eyes?      Sinus congestion and coughing  5. FEVER: Do you have a fever? If Yes, ask: What is your temperature, how was it measured, and when did it start?     denies 6. PUS  ON THE TONSILS: Is there pus on the tonsils in the back of your throat?     Unsure  7. OTHER SYMPTOMS: Do you have any other symptoms? (e.g., difficulty breathing, headache, rash)     Congestion, cough  Protocols used: Sore Throat-A-AH

## 2024-12-08 NOTE — Progress Notes (Unsigned)
 "       Established patient visit   History of Present Illness   Discussed the use of AI scribe software for clinical note transcription with the patient, who gave verbal consent to proceed.  History of Present Illness   April [name] is a 54 year old female who presents with a persistent cough and loss of voice.  Cough - Onset three nights ago - Worse at night when lying down - Wakes her multiple times per night until she sits upright on the couch - Less coughing during the day - Single episode of green sputum production - No significant nasal congestion - No fever - Associated with significant fatigue due to poor sleep  Aphonia and sore throat - Sore throat began last Wednesday and persists - Sudden loss of voice (aphonia) occurred Saturday night after yelling at a loud event and exposure to cold air - Aphonia has persisted since onset  Otolaryngologic symptoms - Ear discomfort developed the day after sore throat onset and has since resolved   Physical Exam   Physical Exam Vitals reviewed.  Constitutional:      General: She is not in acute distress.    Appearance: Normal appearance. She is ill-appearing.  HENT:     Head: Normocephalic and atraumatic.     Right Ear: Ear canal and external ear normal. There is no impacted cerumen.     Left Ear: Ear canal and external ear normal. There is no impacted cerumen.     Nose: Congestion present.     Mouth/Throat:     Mouth: Mucous membranes are moist.     Pharynx: Posterior oropharyngeal erythema present.  Eyes:     General: No scleral icterus.       Right eye: No discharge.        Left eye: No discharge.     Extraocular Movements: Extraocular movements intact.     Conjunctiva/sclera: Conjunctivae normal.     Pupils: Pupils are equal, round, and reactive to light.  Cardiovascular:     Rate and Rhythm: Normal rate and regular rhythm.     Pulses: Normal pulses.     Heart sounds: Normal heart sounds. No murmur heard.    No  friction rub. No gallop.  Pulmonary:     Effort: Pulmonary effort is normal. No respiratory distress.     Breath sounds: Normal breath sounds. No wheezing.  Lymphadenopathy:     Cervical: No cervical adenopathy.  Skin:    General: Skin is warm and dry.  Neurological:     Mental Status: She is alert and oriented to person, place, and time.  Psychiatric:        Mood and Affect: Mood normal.        Behavior: Behavior normal.        Thought Content: Thought content normal.        Judgment: Judgment normal.    Assessment & Plan   Acute bacterial bronchitis Diagnosis of bacterial bronchitis due to prolonged symptoms and lack of improvement. Not contagious, no fever. - POCT flu and covid negative.  - Prescribed Medrol  Dosepak for inflammation and voice recovery. - Prescribed Tussionex 5 mL every 12 hours as needed, especially at night for cough relief. - Start Azithromycin  500mg  today then 250mg  daily x 4 days. - Instructed on taking Medrol  Dosepak all at once in the morning for convenience. - Advised that Tussionex may cause drowsiness, especially at night.     Follow up   Return if  symptoms worsen or fail to improve. __________________________________ Zada FREDRIK Palin, DNP, APRN, FNP-BC Primary Care and Sports Medicine [facility] [place] "

## 2024-12-08 NOTE — Telephone Encounter (Signed)
 Patient shows scheduled tomorrow for 12/09/2024

## 2024-12-09 ENCOUNTER — Encounter: Payer: Self-pay | Admitting: Medical-Surgical

## 2024-12-09 ENCOUNTER — Ambulatory Visit: Admitting: Medical-Surgical

## 2024-12-09 VITALS — BP 129/82 | HR 72 | Temp 98.5°F | Resp 16 | Ht 64.0 in | Wt 192.0 lb

## 2024-12-09 DIAGNOSIS — B9689 Other specified bacterial agents as the cause of diseases classified elsewhere: Secondary | ICD-10-CM | POA: Diagnosis not present

## 2024-12-09 DIAGNOSIS — J208 Acute bronchitis due to other specified organisms: Secondary | ICD-10-CM

## 2024-12-09 LAB — POC COVID19/FLU A&B COMBO
Covid Antigen, POC: NEGATIVE
Influenza A Antigen, POC: NEGATIVE
Influenza B Antigen, POC: NEGATIVE

## 2024-12-09 MED ORDER — AZITHROMYCIN 250 MG PO TABS: ORAL_TABLET | ORAL | 0 refills | Status: AC

## 2024-12-09 MED ORDER — HYDROCOD POLI-CHLORPHE POLI ER 10-8 MG/5ML PO SUER: 5.0000 mL | Freq: Two times a day (BID) | ORAL | 0 refills | Status: AC | PRN

## 2024-12-09 MED ORDER — METHYLPREDNISOLONE 4 MG PO TBPK: ORAL_TABLET | ORAL | 0 refills | Status: AC

## 2024-12-19 ENCOUNTER — Encounter: Payer: Self-pay | Admitting: Physician Assistant

## 2024-12-19 DIAGNOSIS — E66811 Obesity, class 1: Secondary | ICD-10-CM

## 2024-12-20 MED ORDER — WEGOVY 1.5 MG PO TABS: 1.5000 mg | ORAL_TABLET | Freq: Every day | ORAL | 0 refills | Status: AC
